# Patient Record
Sex: Female | Born: 1985 | Race: White | Hispanic: No | Marital: Married | Smoking: Current every day smoker
Health system: Southern US, Community
[De-identification: ages and names within clinical notes are randomized; demographics above are authoritative.]

## PROBLEM LIST (undated history)

## (undated) DIAGNOSIS — E785 Hyperlipidemia, unspecified: Secondary | ICD-10-CM

## (undated) DIAGNOSIS — K760 Fatty (change of) liver, not elsewhere classified: Secondary | ICD-10-CM

## (undated) DIAGNOSIS — N393 Stress incontinence (female) (male): Secondary | ICD-10-CM

## (undated) DIAGNOSIS — R000 Tachycardia, unspecified: Secondary | ICD-10-CM

## (undated) DIAGNOSIS — N39490 Overflow incontinence: Secondary | ICD-10-CM

## (undated) DIAGNOSIS — C801 Malignant (primary) neoplasm, unspecified: Secondary | ICD-10-CM

## (undated) DIAGNOSIS — L732 Hidradenitis suppurativa: Secondary | ICD-10-CM

## (undated) HISTORY — PX: MYRINGOTOMY: SHX2060

## (undated) HISTORY — DX: Hyperlipidemia, unspecified: E78.5

## (undated) HISTORY — PX: BREAST ENHANCEMENT SURGERY: SHX7

## (undated) HISTORY — PX: ABDOMINAL SURGERY: SHX537

## (undated) HISTORY — DX: Fatty (change of) liver, not elsewhere classified: K76.0

## (undated) HISTORY — PX: TEAR DUCT PROBING: SHX793

## (undated) HISTORY — PX: ABDOMINAL HYSTERECTOMY: SHX81

## (undated) HISTORY — PX: TUBAL LIGATION: SHX77

---

## 2003-06-03 ENCOUNTER — Emergency Department (HOSPITAL_COMMUNITY): Admission: EM | Admit: 2003-06-03 | Discharge: 2003-06-04 | Payer: Self-pay | Admitting: Emergency Medicine

## 2003-06-03 ENCOUNTER — Emergency Department (HOSPITAL_COMMUNITY): Admission: AD | Admit: 2003-06-03 | Discharge: 2003-06-03 | Payer: Self-pay | Admitting: Family Medicine

## 2003-06-23 ENCOUNTER — Ambulatory Visit (HOSPITAL_COMMUNITY): Admission: RE | Admit: 2003-06-23 | Discharge: 2003-06-23 | Payer: Self-pay | Admitting: Obstetrics & Gynecology

## 2003-07-14 ENCOUNTER — Inpatient Hospital Stay (HOSPITAL_COMMUNITY): Admission: AD | Admit: 2003-07-14 | Discharge: 2003-07-14 | Payer: Self-pay | Admitting: Obstetrics & Gynecology

## 2005-01-11 ENCOUNTER — Emergency Department (HOSPITAL_COMMUNITY): Admission: EM | Admit: 2005-01-11 | Discharge: 2005-01-11 | Payer: Self-pay | Admitting: Family Medicine

## 2005-03-12 ENCOUNTER — Ambulatory Visit (HOSPITAL_COMMUNITY): Admission: RE | Admit: 2005-03-12 | Discharge: 2005-03-12 | Payer: Self-pay | Admitting: Family Medicine

## 2005-05-09 ENCOUNTER — Emergency Department (HOSPITAL_COMMUNITY): Admission: EM | Admit: 2005-05-09 | Discharge: 2005-05-10 | Payer: Self-pay | Admitting: Emergency Medicine

## 2005-08-12 ENCOUNTER — Emergency Department (HOSPITAL_COMMUNITY): Admission: EM | Admit: 2005-08-12 | Discharge: 2005-08-12 | Payer: Self-pay | Admitting: Emergency Medicine

## 2005-08-12 ENCOUNTER — Inpatient Hospital Stay (HOSPITAL_COMMUNITY): Admission: EM | Admit: 2005-08-12 | Discharge: 2005-08-14 | Payer: Self-pay | Admitting: Psychiatry

## 2005-08-13 ENCOUNTER — Ambulatory Visit: Payer: Self-pay | Admitting: Psychiatry

## 2005-11-15 ENCOUNTER — Emergency Department (HOSPITAL_COMMUNITY): Admission: EM | Admit: 2005-11-15 | Discharge: 2005-11-15 | Payer: Self-pay | Admitting: Emergency Medicine

## 2005-12-11 ENCOUNTER — Encounter: Admission: RE | Admit: 2005-12-11 | Discharge: 2005-12-11 | Payer: Self-pay | Admitting: Obstetrics and Gynecology

## 2007-04-09 ENCOUNTER — Ambulatory Visit: Payer: Self-pay | Admitting: Physician Assistant

## 2007-04-09 ENCOUNTER — Inpatient Hospital Stay (HOSPITAL_COMMUNITY): Admission: AD | Admit: 2007-04-09 | Discharge: 2007-04-09 | Payer: Self-pay | Admitting: Obstetrics & Gynecology

## 2007-05-03 ENCOUNTER — Inpatient Hospital Stay (HOSPITAL_COMMUNITY): Admission: AD | Admit: 2007-05-03 | Discharge: 2007-05-03 | Payer: Self-pay | Admitting: Obstetrics

## 2007-09-09 ENCOUNTER — Emergency Department (HOSPITAL_COMMUNITY): Admission: EM | Admit: 2007-09-09 | Discharge: 2007-09-09 | Payer: Self-pay | Admitting: Emergency Medicine

## 2007-09-30 ENCOUNTER — Observation Stay (HOSPITAL_COMMUNITY): Admission: EM | Admit: 2007-09-30 | Discharge: 2007-10-02 | Payer: Self-pay | Admitting: Emergency Medicine

## 2007-10-04 ENCOUNTER — Emergency Department (HOSPITAL_COMMUNITY): Admission: EM | Admit: 2007-10-04 | Discharge: 2007-10-05 | Payer: Self-pay | Admitting: Emergency Medicine

## 2008-04-29 ENCOUNTER — Emergency Department (HOSPITAL_COMMUNITY): Admission: EM | Admit: 2008-04-29 | Discharge: 2008-04-30 | Payer: Self-pay | Admitting: Emergency Medicine

## 2008-06-01 ENCOUNTER — Emergency Department (HOSPITAL_COMMUNITY): Admission: EM | Admit: 2008-06-01 | Discharge: 2008-06-01 | Payer: Self-pay | Admitting: Emergency Medicine

## 2008-07-14 ENCOUNTER — Emergency Department (HOSPITAL_COMMUNITY): Admission: EM | Admit: 2008-07-14 | Discharge: 2008-07-14 | Payer: Self-pay | Admitting: Family Medicine

## 2010-12-19 NOTE — H&P (Signed)
Robin Melton, Robin Melton            ACCOUNT NO.:  [id number]   MEDICAL RECORD NO.:  [id number]          PATIENT TYPE:  INP   LOCATION:  3021                         FACILITY:  [facility]   PHYSICIAN:  Eduard Clos, MDDATE OF BIRTH:  15-Apr-1986   DATE OF ADMISSION:  09/29/2007  DATE OF DISCHARGE:                              HISTORY & PHYSICAL   CHIEF COMPLAINT:  Chest pain.   HISTORY OF PRESENT ILLNESS:  A 25 year old female with no significant  past medical history, presently complaining of chest pain.  She states  she has been having these symptoms for the last three days which was  increased in intensity and frequency where wherein she decided to come  to the ER. The patient in addition also found that the pain increased  with having something to eat.  In the ER, the patient was found to have  slightly elevated lipase with an normal sonogram.  Because of pain was  not getting improved.  The patient admitted for further management of  her possible pancreatitis.  The patient denies any associated  palpitations, shortness of breath, diaphoresis, but does have mild  nausea but still has an appetite.  Denies any diarrhea, fever or chills.  Denies any dizziness or loss of consciousness.   PAST MEDICAL HISTORY:  Intermittent palpitations for which she takes  Toprol-XL   PAST SURGICAL HISTORY:  Surgery for ear tubes and lacrimal ducts.   MEDICATIONS:  1. Toprol-XL 12.5 mg p.o. twice daily.  2. Multivitamins.   ALLERGIES:  SULFA.   FAMILY HISTORY:  Significant for patient's family having diabetes and  CAD.   SOCIAL HISTORY:  The patient denies smoking cigarettes. Drinks alcohol  occasionally.  Denies any drug abuse.   REVIEW OF SYSTEMS:  As per history of present illness.  Nothing of  significance.   PHYSICAL EXAMINATION:  GENERAL:  Patient examined at bedside, in no  acute distress.  VITAL SIGNS:  Blood pressure is 114/70, pulse 80 per minute, temperature  97.7,  respirations 18 per minute, oxygen saturation 96%.  HEENT:  Anicteric, no pallor.  .  CHEST:  Bilateral air entry present.  No rhonchi, no crepitations.  HEART:  S1 and S2.  ABDOMEN:  There is mild epigastric and right flank tenderness.  No  rigidity.  No discoloration.  Bowel sounds stable.  CNS:  Alert and oriented to time, place and person.  Motor in lower  extremities 5/5.  EXTREMITIES:  Peripheral pulses failed. No edema.   LABORATORY DATA:  Urine pregnancy negative.  CMP:  Sodium 138, potassium  4.2, chloride 105, carbon dioxide 25, glucose 94, BUN 15, creatinine  0.6, calcium 9.2, albumin 4.2, AST 20, ALT 26, alk phos 79, total  bilirubin 0.6.  Troponin less than 0.05.  CK-MB less than 1.  CBC:  WBC  7.5, hemoglobin 14.9, hematocrit 42.8, platelets 289,000. Urinalysis:  Leukocytes moderate.  Wbc's 3 to 6.  Lipase 79.   ASSESSMENT:  1. Possible pancreatitis.  2. Chest pain.  3. Possible urinary tract infection.   PLAN:  1. Admit the patient to telemetry, under team G of [facility].  2. Cardiac enzymes.  3. Will place the patient NPO except medications.  Start on IV fluids.  4. Followup urine culture.  5. Place the patient on pain medications.  6. As the patient's condition improves, we will start her on oral      diet.  7. Also get a CT scan of the abdomen and pelvis with contrast.      Eduard Clos, MD  Electronically Signed     ANK/MEDQ  D:  09/30/2007  T:  09/30/2007  Job:  [id number]

## 2010-12-22 NOTE — Discharge Summary (Signed)
Robin Melton, Robin Melton NO.:  [id number]   MEDICAL RECORD NO.:  [id number]          PATIENT TYPE:  IPS   LOCATION:  0301                          [facility]ACILITY[facility]  [facility]   PHYSICIAN:  Jeanice Lim, M.D. DATE OF BIRTH:  06/27/1986   DATE OF ADMISSION:  08/12/2005  DATE OF DISCHARGE:  08/14/2005                                 DISCHARGE SUMMARY   IDENTIFYING DATA:  This is a 25 year old married Caucasian female,  voluntarily admitted, presented after becoming tearful, panicky, felt she  would rather die than go on with how she felt.  Mother became agitated.  Endorsed 6 months progressive increase of depressed mood with mood lability,  racing thoughts, violence and bloody nightmares.  Primary care Safiya Girdler  [facility].  No alcohol or drug history.   ADMISSION MEDICATIONS:  None.   ALLERGIES:  SULFA.   PHYSICAL AND NEUROLOGICAL EXAMINATION:  Essentially within normal limits.  Neurologically nonfocal,   MENTAL STATUS EXAM:  Fully alert, tearful, labile affect, anxious, tearful.  Soft tone, decreased amount, decreased productivity.  Mood labile,  dysphoric, anxious, tearful, depressed, overwhelmed, desperate.  Thought  process positive passive suicidal ideation, no clear plan, would rather die  than feel the way she feels, no homicidal ideation, no auditory or visual  hallucinations.  Cognitively intact.  Judgment and insight impaired.   ADMISSION DIAGNOSES:  AXIS I:  Bipolar disorder II, rule out generalized  anxiety disorder or major depressive disorder, severe, single episode.  AXIS II:  None.  AXIS III:  None.  AXIS IV:  Moderate stressors.  AXIS V:  30/65.   HOSPITAL COURSE:  The patient was admitted and ordered routine p.r.n.  medications, underwent further monitoring, and was encouraged to participate  in individual, group and milieu therapy.  The patient was placed on safety  checks, pregnancy test was done.  Medications were reconciled.  The  patient  was started on Zoloft targeting depressive and anxiety symptoms, and  Risperdal due to the degree of agitation, low dose.  The patient reports  doing well after sleeping, first night that she had slept well in 18 months  as per patient, feeling much better, reporting no acute suicidal ideation,  more calm, less labile.  Husband wanted patient and patient wanted to be  discharged due to the trauma of her being hospital confined, feeling that  this was counter-therapeutic since she was doing much better and feeling  completely safe and that she would do well in intensive outpatient setting,  which she was agreeable with, therefore the patient was discharged with no  acute safety issues or psychiatric clinical criteria to continue inpatient  treatment.  The patient was discharged after medication education, advised  to have caution with driving and risk of EPS, TD, importance of monitoring  tolerance issues, dependency issues and precautions and medication education  reviewed at time of discharge, including purpose and side effects, and  discharged on:  1.  Celexa 10 mg q.a.m.  2.  Risperdal 0.5 mg at 8 p.m.  3.  Depakote ER 500 mg at 8 p.m.  4.  Ambien  10 mg q.h.s. p.r.n.  5.  Klonopin 0.5 mg 1/2 at 8 p.m.  The patient was advised this should be a      short-term medication.   DISPOSITION:  The patient was to follow up at [facility]  Intensive Outpatient clinic for very close monitoring due to brief stay  and  would start on Wednesday, January 10 at 8:45.   DISCHARGE DIAGNOSES:  AXIS I:  Bipolar disorder II, rule out generalized  anxiety disorder or major depressive disorder, severe, single episode.  AXIS II:  None.  AXIS III:  None.  AXIS IV:  Moderate stressors.  AXIS V:  Global assessment of functioning on discharge was 55-60.      Jeanice Lim, M.D.  Electronically Signed     JEM/MEDQ  D:  09/08/2005  T:  09/09/2005  Job:  [id number]

## 2010-12-22 NOTE — Consult Note (Signed)
NAMEIBTISAM, BENGE            ACCOUNT NO.:  [id number]   MEDICAL RECORD NO.:  [id number]          PATIENT TYPE:  EMS   LOCATION:  ED                           FACILITY:  [facility]   PHYSICIAN:  Kristine Garbe. Ezzard Standing, M.D.DATE OF BIRTH:  01/03/1986   DATE OF CONSULTATION:  11/15/2005  DATE OF DISCHARGE:                                   CONSULTATION   REASON FOR CONSULTATION:  Robin Melton is a 25 year old female who has  had a sore throat for a couple of days.  This evening, she had a little bit  of difficulty swallowing especially when she was lying down.  She presented  to the emergency room where she had a lateral soft tissue x-ray of the neck  which demonstrated some mild supraglottic swelling and ENT was consulted.  On examination, the patient has no airway problems and no trouble speaking  at all.  Neck exam revealed no significant swelling in the neck.  Oral exam  revealed generous sized tonsils with no exudate.  Indirect laryngoscopy  revealed a clear base of tongue.  Epiglottis was normal with no significant  swelling.  She had some slight edema of the arytenoids, but vocal cords were  clear and airway was clear.   IMPRESSION:  Pharyngitis.   RECOMMENDATIONS:  She had an IV placed and gave her a dose of Rocephin 2 g  IV and placed her on Augmentin suspension 600 mg b.i.d. x1 week and Tylenol  p.r.n. pain.  She will follow up in my office if she had any increasing  problems.           ______________________________  Kristine Garbe Ezzard Standing, M.D.     CEN/MEDQ  D:  11/15/2005  T:  11/15/2005  Job:  [id number]

## 2011-04-27 LAB — CBC
HCT: 37.1
HCT: 40
HCT: 42.8
Hemoglobin: 12.9
Hemoglobin: 14.9
MCHC: 34.9
MCV: 88.1
MCV: 89.5
Platelets: 273
Platelets: 289
RBC: 4.16
RBC: 4.78
RDW: 12.9
RDW: 13.2
WBC: 5.4
WBC: 7.5

## 2011-04-27 LAB — COMPREHENSIVE METABOLIC PANEL
ALT: 26
AST: 20
Albumin: 3.6
Albumin: 4.2
Alkaline Phosphatase: 79
BUN: 15
BUN: 7
CO2: 25
Calcium: 9
Calcium: 9.2
Chloride: 105
Creatinine, Ser: 0.65
Creatinine, Ser: 0.66
GFR calc Af Amer: >60
GFR calc non Af Amer: >60
Glucose, Bld: 94
Potassium: 3.5
Potassium: 4.2
Sodium: 138
Total Bilirubin: 0.6
Total Protein: 6.5
Total Protein: 7.4

## 2011-04-27 LAB — URINALYSIS, ROUTINE W REFLEX MICROSCOPIC
Bilirubin Urine: NEGATIVE
Glucose, UA: NEGATIVE
Hgb urine dipstick: NEGATIVE
Ketones, ur: NEGATIVE
Ketones, ur: NEGATIVE
Nitrite: NEGATIVE
Nitrite: NEGATIVE
Protein, ur: NEGATIVE
Specific Gravity, Urine: 1.015
Specific Gravity, Urine: 1.021
Urobilinogen, UA: 0.2
pH: 5
pH: 7

## 2011-04-27 LAB — POCT CARDIAC MARKERS
CKMB, poc: <1 — ABNORMAL LOW
Myoglobin, poc: 16.8
Operator id: 277751
Troponin i, poc: <0.05

## 2011-04-27 LAB — BASIC METABOLIC PANEL
BUN: 14
BUN: 3 — ABNORMAL LOW
CO2: 25
Calcium: 8.7
Chloride: 105
Chloride: 109
Creatinine, Ser: 0.52
GFR calc Af Amer: >60
GFR calc non Af Amer: >60
Glucose, Bld: 100 — ABNORMAL HIGH
Glucose, Bld: 88
Potassium: 3.6
Potassium: 3.7
Sodium: 138

## 2011-04-27 LAB — DIFFERENTIAL
Basophils Absolute: 0.1
Basophils Relative: 1
Eosinophils Absolute: 0.2
Eosinophils Relative: 2
Lymphocytes Relative: 26
Lymphocytes Relative: 38
Lymphs Abs: 2.3
Lymphs Abs: 2.9
Monocytes Absolute: 0.6
Monocytes Absolute: 0.7
Monocytes Relative: 7
Monocytes Relative: 8
Neutro Abs: 3.9
Neutro Abs: 5.7
Neutrophils Relative %: 51

## 2011-04-27 LAB — PREGNANCY, URINE
Preg Test, Ur: NEGATIVE
Preg Test, Ur: NEGATIVE

## 2011-04-27 LAB — URINE CULTURE: Colony Count: 35000

## 2011-04-27 LAB — CK TOTAL AND CKMB (NOT AT ARMC)
CK, MB: 0.7
Relative Index: INVALID
Total CK: 58

## 2011-04-27 LAB — CARDIAC PANEL(CRET KIN+CKTOT+MB+TROPI)
CK, MB: 0.6
CK, MB: 0.6
Relative Index: INVALID
Relative Index: INVALID
Relative Index: INVALID
Total CK: 56
Troponin I: <0.01

## 2011-04-27 LAB — LIPID PANEL
Cholesterol: 248 — ABNORMAL HIGH
HDL: 26 — ABNORMAL LOW
LDL Cholesterol: UNDETERMINED
Total CHOL/HDL Ratio: 9.5
Triglycerides: 488 — ABNORMAL HIGH
VLDL: UNDETERMINED

## 2011-04-27 LAB — TSH: TSH: 1.755

## 2011-04-27 LAB — LIPASE, BLOOD
Lipase: 65 — ABNORMAL HIGH
Lipase: 79 — ABNORMAL HIGH

## 2011-04-27 LAB — TROPONIN I: Troponin I: 0.01

## 2011-04-27 LAB — URINE MICROSCOPIC-ADD ON

## 2011-04-30 LAB — POCT CARDIAC MARKERS
CKMB, poc: <1 — ABNORMAL LOW
Troponin i, poc: <0.05

## 2011-05-07 LAB — POCT CARDIAC MARKERS
CKMB, poc: <1 — ABNORMAL LOW
Troponin i, poc: <0.05

## 2011-05-07 LAB — URINALYSIS, ROUTINE W REFLEX MICROSCOPIC
Nitrite: NEGATIVE
Protein, ur: NEGATIVE
Specific Gravity, Urine: 1.029
Urobilinogen, UA: 0.2

## 2011-05-07 LAB — URINE CULTURE: Colony Count: >=100000

## 2011-05-07 LAB — URINE MICROSCOPIC-ADD ON

## 2011-05-11 LAB — POCT URINALYSIS DIP (DEVICE)
Glucose, UA: NEGATIVE mg/dL
Hgb urine dipstick: NEGATIVE
Ketones, ur: NEGATIVE mg/dL
Specific Gravity, Urine: 1.02 (ref 1.005–1.030)
Urobilinogen, UA: 0.2 mg/dL (ref 0.0–1.0)

## 2011-05-11 LAB — URINE CULTURE

## 2011-05-17 LAB — URINALYSIS, ROUTINE W REFLEX MICROSCOPIC
Bilirubin Urine: NEGATIVE
Hgb urine dipstick: NEGATIVE
Ketones, ur: NEGATIVE
Nitrite: NEGATIVE
Urobilinogen, UA: 0.2

## 2011-05-18 LAB — URINALYSIS, ROUTINE W REFLEX MICROSCOPIC
Glucose, UA: NEGATIVE
Hgb urine dipstick: NEGATIVE
Protein, ur: 30 — AB
Urobilinogen, UA: 1

## 2011-05-18 LAB — URINE MICROSCOPIC-ADD ON

## 2011-05-18 LAB — URINE CULTURE
Colony Count: NO GROWTH
Culture: NO GROWTH

## 2011-05-18 LAB — WET PREP, GENITAL: Clue Cells Wet Prep HPF POC: NONE SEEN

## 2013-06-29 ENCOUNTER — Emergency Department (INDEPENDENT_AMBULATORY_CARE_PROVIDER_SITE_OTHER)
Admission: EM | Admit: 2013-06-29 | Discharge: 2013-06-29 | Disposition: A | Discharge status: Home / Self Care | Source: Home / Self Care | Attending: Family Medicine | Admitting: Family Medicine

## 2013-06-29 ENCOUNTER — Encounter: Payer: Self-pay | Admitting: Emergency Medicine

## 2013-06-29 DIAGNOSIS — R300 Dysuria: Secondary | ICD-10-CM

## 2013-06-29 DIAGNOSIS — N390 Urinary tract infection, site not specified: Secondary | ICD-10-CM

## 2013-06-29 LAB — POCT URINALYSIS DIP (MANUAL ENTRY)
Glucose, UA: NEGATIVE
Ketones, POC UA: NEGATIVE
Protein Ur, POC: NEGATIVE
Spec Grav, UA: 1.025 (ref 1.005–1.03)
Urobilinogen, UA: 0.2 (ref 0–1)
pH, UA: 5.5 (ref 5–8)

## 2013-06-29 LAB — POCT CBC W AUTO DIFF (K'VILLE URGENT CARE)

## 2013-06-29 MED ORDER — PHENAZOPYRIDINE HCL 200 MG PO TABS: 200.0000 mg | ORAL_TABLET | Freq: Three times a day (TID) | ORAL | Status: DC

## 2013-06-29 MED ORDER — CIPROFLOXACIN HCL 500 MG PO TABS: 500.0000 mg | ORAL_TABLET | Freq: Two times a day (BID) | ORAL | Status: DC

## 2013-06-29 NOTE — ED Provider Notes (Signed)
CSN: [id number]     Arrival date & time 06/29/13  4098 History   First MD Initiated Contact with Patient 06/29/13 [phone]     Chief Complaint  Patient presents with  . Dysuria     HPI Comments: Patient complains of onset of dysuria about two weeks ago that has become worse.  She has developed frequency, urgency, hesitancy, nocturia, and occasional leakage.  Over the past week she has had a low grade fever.  She has nausea but no vomiting.  She has developed lower back ache. Her last UTI was about two years ago.  Patient is a 27 y.o. female presenting with dysuria. The history is provided by the patient.  Dysuria This is a new problem. Episode onset: 2 weeks ago. The problem occurs constantly. The problem has been gradually worsening. Pertinent negatives include no abdominal pain. Exacerbated by: urination. Nothing relieves the symptoms. Treatments tried: Azo. The treatment provided no relief.    History reviewed. No pertinent past medical history. Past Surgical History  Procedure Laterality Date  . Tubal ligation    . Myringotomy    . Tear duct probing     Family History  Problem Relation Age of Onset  . Diabetes Mother   . Hypertension Mother   . Cancer Mother     skin CA  . Diabetes Sister    History  Substance Use Topics  . Smoking status: Current Every Day Smoker -- 0.50 packs/day    Types: Cigarettes  . Smokeless tobacco: Never Used  . Alcohol Use: No    Review of Systems  Gastrointestinal: Negative for abdominal pain.  Genitourinary: Positive for dysuria.  All other systems reviewed and are negative.    Allergies  Bee venom; Morphine and related; and Sulfur  Home Medications   Current Outpatient Rx  Name  Route  Sig  Dispense  Refill  . ciprofloxacin (CIPRO) 500 MG tablet   Oral   Take 1 tablet (500 mg total) by mouth 2 (two) times daily.   14 tablet   0   . phenazopyridine (PYRIDIUM) 200 MG tablet   Oral   Take 1 tablet (200 mg total) by mouth 3 (three)  times daily. Take with food.   6 tablet   0    BP 130/82  Pulse 96  Temp(Src) 97.8 F (36.6 C) (Oral)  Resp 14  Ht 5\' 2"  (1.575 m)  Wt 232 lb (105.235 kg)  BMI 42.42 kg/m2  SpO2 98% Physical Exam Nursing notes and Vital Signs reviewed. Appearance:  Patient appears stated age, and in no acute distress.  Patient is obese (BMI 42.4) Eyes:  Pupils are equal, round, and reactive to light and accomodation.  Extraocular movement is intact.  Conjunctivae are not inflamed  Pharynx:  Normal; moist mucous membranes  Neck:  Supple.   No adenopathy Lungs:  Clear to auscultation.  Breath sounds are equal.  Heart:  Regular rate and rhythm without murmurs, rubs, or gallops.  Abdomen:   Mild tenderness over bladder without masses or hepatosplenomegaly.  Bowel sounds are present.   Right CVA/flank tenderness present. Extremities:  No edema.  No calf tenderness Skin:  No rash present.   ED Course  Procedures  none    Labs Reviewed  URINE CULTURE  POCT URINALYSIS DIP (MANUAL ENTRY):  BLO trace- lysed; LEU moderate, otherwise negative  POCT CBC:  WBC 7.8; LY 31.4; MO 7.8; GR 60.8; Hgb 14.2; Platelets 219       MDM  1. Dysuria   2. UTI (urinary tract infection); normal white blood count reassuring    Urine culture pending.  Begin Cipro and Pyridium Increase fluid intake. If symptoms become significantly worse during the night or over the weekend, proceed to the local emergency room. Followup with Family Doctor if not improved in one week.     Lattie Haw, MD 06/29/13 [phone]

## 2013-06-29 NOTE — ED Notes (Signed)
Robin Melton c/o 2 weeks of low back pain, dysuria and polyuria. Intermittent chills and sweats. No hematuria.

## 2013-07-01 LAB — URINE CULTURE: Colony Count: >=100000

## 2013-08-05 ENCOUNTER — Other Ambulatory Visit: Payer: Self-pay

## 2013-11-05 ENCOUNTER — Encounter: Payer: Self-pay | Admitting: Emergency Medicine

## 2013-11-05 ENCOUNTER — Emergency Department (INDEPENDENT_AMBULATORY_CARE_PROVIDER_SITE_OTHER)
Admission: EM | Admit: 2013-11-05 | Discharge: 2013-11-05 | Disposition: A | Discharge status: Home / Self Care | Source: Home / Self Care | Attending: Emergency Medicine | Admitting: Emergency Medicine

## 2013-11-05 DIAGNOSIS — I471 Supraventricular tachycardia, unspecified: Secondary | ICD-10-CM

## 2013-11-05 DIAGNOSIS — M940 Chondrocostal junction syndrome [Tietze]: Secondary | ICD-10-CM

## 2013-11-05 HISTORY — DX: Tachycardia, unspecified: R00.0

## 2013-11-05 MED ORDER — METOPROLOL TARTRATE 25 MG PO TABS: 25.0000 mg | ORAL_TABLET | Freq: Two times a day (BID) | ORAL | Status: DC

## 2013-11-05 NOTE — ED Notes (Signed)
Chest pain, tachycardia, SOB started this am

## 2013-11-05 NOTE — ED Provider Notes (Signed)
CSN: [id number]     Arrival date & time 11/05/13  1719 History   First MD Initiated Contact with Patient 11/05/13 1737     Chief Complaint  Patient presents with  . Chest Pain   (Consider location/radiation/quality/duration/timing/severity/associated sxs/prior Treatment) HPI Patient with history of SVT (her normal BP is 110-130) comes in today with SVT symptoms.  She was fixing her hair this morning and feels she twisted her torso and developed L sided CP.  Tender to touch, continuous, certain movements exacerbate, resting takes away.  Cardio doesn't worsen.  She used to be on Metoprolol 25mg  BID, but stopped 1 year ago because couldn't follow up with cardiology because didn't have insurance.  On meds, HR around 90-100.  She feels somewhat anxious, but feels she just pulled a muscle.  No SOB, radiation of pain, blurry vision, LOC, HA.  No history of anxiety, but she says that she has a lot of stress in life.   Past Medical History  Diagnosis Date  . Tachycardia    Past Surgical History  Procedure Laterality Date  . Tubal ligation    . Myringotomy    . Tear duct probing     Family History  Problem Relation Age of Onset  . Diabetes Mother   . Hypertension Mother   . Cancer Mother     skin CA  . Diabetes Sister    History  Substance Use Topics  . Smoking status: Current Every Day Smoker -- 0.50 packs/day    Types: Cigarettes  . Smokeless tobacco: Never Used  . Alcohol Use: No    Review of Systems  All other systems reviewed and are negative.    Allergies  Bee venom; Morphine and related; and Sulfur  Home Medications   Current Outpatient Rx  Name  Route  Sig  Dispense  Refill  . ciprofloxacin (CIPRO) 500 MG tablet   Oral   Take 1 tablet (500 mg total) by mouth 2 (two) times daily.   14 tablet   0   . metoprolol tartrate (LOPRESSOR) 25 MG tablet   Oral   Take 1 tablet (25 mg total) by mouth 2 (two) times daily.   60 tablet   0   . phenazopyridine (PYRIDIUM) 200  MG tablet   Oral   Take 1 tablet (200 mg total) by mouth 3 (three) times daily. Take with food.   6 tablet   0    BP 135/87  Pulse 123  Temp(Src) 98.2 F (36.8 C) (Oral)  Ht 5\' 2"  (1.575 m)  Wt 223 lb (101.152 kg)  BMI 40.78 kg/m2  SpO2 99% Physical Exam  Nursing note and vitals reviewed. Constitutional: He is oriented to person, place, and time. He appears well-developed and well-nourished. He is cooperative.  Non-toxic appearance. No distress.  HENT:  Head: Normocephalic and atraumatic.  Right Ear: Tympanic membrane, external ear and ear canal normal.  Left Ear: Tympanic membrane and ear canal normal.  Nose: Nose normal.  Mouth/Throat: Uvula is midline and oropharynx is clear and moist.  Eyes: No scleral icterus.  Neck: Neck supple. Normal range of motion present.  Cardiovascular: Regular rhythm and normal heart sounds.   No extrasystoles are present. Tachycardia present.  PMI is not displaced.   No murmur heard. Pulmonary/Chest: Effort normal and breath sounds normal. No respiratory distress. He has no decreased breath sounds. He has no wheezes. He has no rhonchi.    Neurological: He is alert and oriented to person, place, and time.  Skin: Skin is warm and dry.  Psychiatric: His speech is normal and behavior is normal. Thought content normal. His mood appears anxious (slightly).    ED Course  Procedures (including critical care time) Labs Review Labs Reviewed - No data to display Imaging Review No results found.   MDM   1. Paroxysmal supraventricular tachycardia    Chronic SVT & acute costochondritis.  Carotid massage didn't change HR (123 to 119).  This HR is her normal HR.  EKG shows sinus tach, but otherwise normal.  Pt given Rx for Metoprolol 25mg  BID.  Info given to follow up with cardiology now that she has insurance.  ER precautions given for worsening symptoms, chest pain, SOB, etc.  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    Janeann Forehand,  MD 11/05/13 [phone]

## 2013-12-15 ENCOUNTER — Encounter: Payer: Self-pay | Admitting: Cardiology

## 2013-12-15 ENCOUNTER — Encounter: Payer: Self-pay | Admitting: *Deleted

## 2013-12-15 DIAGNOSIS — R079 Chest pain, unspecified: Secondary | ICD-10-CM | POA: Insufficient documentation

## 2013-12-15 DIAGNOSIS — R000 Tachycardia, unspecified: Secondary | ICD-10-CM | POA: Insufficient documentation

## 2013-12-16 ENCOUNTER — Encounter: Payer: Self-pay | Admitting: *Deleted

## 2013-12-16 ENCOUNTER — Encounter: Payer: Self-pay | Admitting: Cardiology

## 2013-12-16 ENCOUNTER — Ambulatory Visit (INDEPENDENT_AMBULATORY_CARE_PROVIDER_SITE_OTHER): Admitting: Cardiology

## 2013-12-16 VITALS — BP 126/84 | HR 101 | Ht 62.0 in | Wt 231.1 lb

## 2013-12-16 DIAGNOSIS — R000 Tachycardia, unspecified: Secondary | ICD-10-CM

## 2013-12-16 DIAGNOSIS — Z720 Tobacco use: Secondary | ICD-10-CM

## 2013-12-16 DIAGNOSIS — R0609 Other forms of dyspnea: Secondary | ICD-10-CM

## 2013-12-16 DIAGNOSIS — R0600 Dyspnea, unspecified: Secondary | ICD-10-CM

## 2013-12-16 DIAGNOSIS — F17200 Nicotine dependence, unspecified, uncomplicated: Secondary | ICD-10-CM

## 2013-12-16 DIAGNOSIS — R0989 Other specified symptoms and signs involving the circulatory and respiratory systems: Secondary | ICD-10-CM

## 2013-12-16 MED ORDER — METOPROLOL TARTRATE 25 MG PO TABS: 25.0000 mg | ORAL_TABLET | Freq: Two times a day (BID) | ORAL | Status: DC

## 2013-12-16 NOTE — Assessment & Plan Note (Signed)
Symptoms most consistent with musculoskeletal pain. Given palpitations and dyspnea I will arrange an echocardiogram to assess LV function and wall motion.

## 2013-12-16 NOTE — Assessment & Plan Note (Signed)
Patient counseled on discontinuing. 

## 2013-12-16 NOTE — Patient Instructions (Signed)
Your physician recommends that you schedule a follow-up appointment in: 3 MONTHS WITH DR CRENSHAW  Your physician has requested that you have an echocardiogram. Echocardiography is a painless test that uses sound waves to create images of your heart. It provides your doctor with information about the size and shape of your heart and how well your heart's chambers and valves are working. This procedure takes approximately one hour. There are no restrictions for this procedure.   Your physician has recommended that you wear a 48 HOUR holter monitor. Holter monitors are medical devices that record the heart's electrical activity. Doctors most often use these monitors to diagnose arrhythmias. Arrhythmias are problems with the speed or rhythm of the heartbeat. The monitor is a small, portable device. You can wear one while you do your normal daily activities. This is usually used to diagnose what is causing palpitations/syncope (passing out).    

## 2013-12-16 NOTE — Progress Notes (Signed)
     HPI: 28 year old female for evaluation of chest pain and palpitations. Patient states she has had an elevated heart rate for years. When it reaches 120 she notices a pounding sensation in her chest. She has dyspnea on exertion but no orthopnea, PND or pedal edema. She also has dizziness with exertion. She was seen in [place] approximately 6 years ago and had a workup and was told she had tachycardia. She was placed on metoprolol. She was recently seen also for chest pain and increase with moving her left upper extremity. No history of syncope.   Current Outpatient Prescriptions  Medication Sig Dispense Refill  . metoprolol tartrate (LOPRESSOR) 25 MG tablet Take 25 mg by mouth 2 (two) times daily.       No current facility-administered medications for this visit.    Allergies  Allergen Reactions  . Bee Venom Hives and Swelling  . Morphine And Related   . Sulfur     Past Medical History  Diagnosis Date  . Tachycardia     Past Surgical History  Procedure Laterality Date  . Tubal ligation    . Myringotomy    . Tear duct probing      History   Social History  . Marital Status: Married    Spouse Name: N/A    Number of Children: 3  . Years of Education: N/A   Occupational History  .  Other   Social History Main Topics  . Smoking status: Current Every Day Smoker -- 0.50 packs/day    Types: Cigarettes  . Smokeless tobacco: Never Used  . Alcohol Use: Yes     Comment: Rarely  . Drug Use: No  . Sexual Activity: Not on file   Other Topics Concern  . Not on file   Social History Narrative  . No narrative on file    Family History  Problem Relation Age of Onset  . Diabetes Mother   . Hypertension Mother   . Cancer Mother     skin CA  . Diabetes Sister     ROS: no fevers or chills, productive cough, hemoptysis, dysphasia, odynophagia, melena, hematochezia, dysuria, hematuria, rash, seizure activity, orthopnea, PND, pedal edema, claudication. Remaining  systems are negative.  Physical Exam:   Blood pressure 126/84, pulse 101, height 5\' 2"  (1.575 m), weight 231 lb 1.3 oz (104.817 kg).  General:  Well developed/obese in NAD Skin warm/dry Patient not depressed No peripheral clubbing Back-normal HEENT-normal/normal eyelids Neck supple/normal carotid upstroke bilaterally; no bruits; no JVD; no thyromegaly chest - CTA/ normal expansion CV - RRR/normal S1 and S2; no murmurs, rubs or gallops;  PMI nondisplaced Abdomen -NT/ND, no HSM, no mass, + bowel sounds, no bruit 2+ femoral pulses, no bruits Ext-no edema, chords, 2+ DP Neuro-grossly nonfocal  ECG Sinus rhythm, right axis deviation, Slight diffuse ST elevation.

## 2013-12-16 NOTE — Assessment & Plan Note (Signed)
Patient states she has an elevated heart rate on a daily basis. This appears to be a sinus tachycardia although not entirely clear. Schedule Holter monitor to further assess. Obtain records from previous cardiologist. I will also check a TSH and hemoglobin. Continue metoprolol. We can increase the dose in the future if needed.

## 2013-12-17 LAB — TSH: TSH: 1.152 u[IU]/mL (ref 0.350–4.500)

## 2013-12-17 LAB — CBC
HEMATOCRIT: 40.8 % (ref 39.0–52.0)
HEMOGLOBIN: 14.3 g/dL (ref 13.0–17.0)
MCH: 32.6 pg (ref 26.0–34.0)
MCHC: 35 g/dL (ref 30.0–36.0)
MCV: 92.9 fL (ref 78.0–100.0)
PLATELETS: 252 10*3/uL (ref 150–400)
RBC: 4.39 MIL/uL (ref 4.22–5.81)
RDW: 13.2 % (ref 11.5–15.5)
WBC: 7.6 10*3/uL (ref 4.0–10.5)

## 2013-12-30 ENCOUNTER — Encounter: Payer: Self-pay | Admitting: *Deleted

## 2013-12-30 ENCOUNTER — Encounter (INDEPENDENT_AMBULATORY_CARE_PROVIDER_SITE_OTHER)

## 2013-12-30 DIAGNOSIS — R0989 Other specified symptoms and signs involving the circulatory and respiratory systems: Secondary | ICD-10-CM

## 2013-12-30 DIAGNOSIS — R0600 Dyspnea, unspecified: Secondary | ICD-10-CM

## 2013-12-30 DIAGNOSIS — R0609 Other forms of dyspnea: Secondary | ICD-10-CM

## 2013-12-30 DIAGNOSIS — R000 Tachycardia, unspecified: Secondary | ICD-10-CM

## 2013-12-30 NOTE — Progress Notes (Signed)
Patient ID: Robin Melton, female   DOB: 06-30-1986, 30 y.o.   MRN: [id number] [name] 48 hour holter monitor applied to patient.

## 2014-01-01 ENCOUNTER — Ambulatory Visit (HOSPITAL_COMMUNITY): Attending: Cardiology | Admitting: Radiology

## 2014-01-01 ENCOUNTER — Encounter: Payer: Self-pay | Admitting: *Deleted

## 2014-01-01 DIAGNOSIS — R0600 Dyspnea, unspecified: Secondary | ICD-10-CM

## 2014-01-01 DIAGNOSIS — R0609 Other forms of dyspnea: Secondary | ICD-10-CM | POA: Insufficient documentation

## 2014-01-01 DIAGNOSIS — R072 Precordial pain: Secondary | ICD-10-CM

## 2014-01-01 DIAGNOSIS — R0989 Other specified symptoms and signs involving the circulatory and respiratory systems: Principal | ICD-10-CM | POA: Insufficient documentation

## 2014-01-01 DIAGNOSIS — R0602 Shortness of breath: Secondary | ICD-10-CM

## 2014-01-01 DIAGNOSIS — R000 Tachycardia, unspecified: Secondary | ICD-10-CM

## 2014-01-01 NOTE — Progress Notes (Signed)
Echocardiogram performed.  

## 2014-01-07 ENCOUNTER — Telehealth: Payer: Self-pay | Admitting: *Deleted

## 2014-01-07 NOTE — Telephone Encounter (Signed)
Monitor reviewed by dr Stanford Breed shows sinus with rare couplet. Mob[place] 1 early am.   Left message for pt to call for monitor and lab results

## 2014-01-11 NOTE — Telephone Encounter (Signed)
F/u    Pt returning a call and stated no one called her back.

## 2014-01-11 NOTE — Telephone Encounter (Signed)
lmtcb

## 2014-01-11 NOTE — Telephone Encounter (Signed)
I spoke with pt & she is a

## 2014-01-11 NOTE — Telephone Encounter (Signed)
I spoke with pt & she is aware of echo & holter monitor results Horton Chin RN

## 2014-01-11 NOTE — Telephone Encounter (Signed)
Follow up ° ° ° ° °Returning a nurses call to get test results °

## 2014-02-28 ENCOUNTER — Emergency Department (INDEPENDENT_AMBULATORY_CARE_PROVIDER_SITE_OTHER)
Admission: EM | Admit: 2014-02-28 | Discharge: 2014-02-28 | Disposition: A | Discharge status: Home / Self Care | Source: Home / Self Care | Attending: Emergency Medicine | Admitting: Emergency Medicine

## 2014-02-28 ENCOUNTER — Encounter: Payer: Self-pay | Admitting: Emergency Medicine

## 2014-02-28 DIAGNOSIS — J209 Acute bronchitis, unspecified: Secondary | ICD-10-CM

## 2014-02-28 DIAGNOSIS — J0100 Acute maxillary sinusitis, unspecified: Secondary | ICD-10-CM

## 2014-02-28 HISTORY — DX: Malignant (primary) neoplasm, unspecified: C80.1

## 2014-02-28 MED ORDER — AZITHROMYCIN 250 MG PO TABS: ORAL_TABLET | ORAL | Status: DC

## 2014-02-28 MED ORDER — BENZONATATE 100 MG PO CAPS: 100.0000 mg | ORAL_CAPSULE | Freq: Three times a day (TID) | ORAL | Status: DC

## 2014-02-28 NOTE — ED Provider Notes (Signed)
CSN: [id number]     Arrival date & time 02/28/14  1452 History   First MD Initiated Contact with Patient 02/28/14 1454     Chief Complaint  Patient presents with  . Cough  . Nasal Congestion  . Generalized Body Aches   (Consider location/radiation/quality/duration/timing/severity/associated sxs/prior Treatment) HPI Enas complains of fevers, chills, body aches, eye drainage, sore throat, runny nose with yellow drainage, wheezing at night and productive cough with yellow sputum for 4 days.   URI HISTORY  Darwin is a 28 y.o. female who complains of onset of cold symptoms for 4 days.  Have been using over-the-counter treatment which helps a little bit.  + chills/sweats +  Fever  +  Nasal congestion +  Discolored Post-nasal drainage + sinus pain/pressure + sore throat  +  cough Rare wheezing, at night + chest congestion No hemoptysis No shortness of breath No pleuritic pain  No itchy/red eyes No earache  Minimal nausea No vomiting No abdominal pain No current diarrhea  No skin rashes +  Fatigue Mild myalgias No headache   Past Medical History  Diagnosis Date  . Tachycardia   . Cancer     skin   Past Surgical History  Procedure Laterality Date  . Tubal ligation    . Myringotomy    . Tear duct probing     Family History  Problem Relation Age of Onset  . Diabetes Mother   . Hypertension Mother   . Cancer Mother     skin CA  . Diabetes Sister    History  Substance Use Topics  . Smoking status: Current Every Day Smoker -- 1.00 packs/day for 12 years    Types: Cigarettes  . Smokeless tobacco: Never Used  . Alcohol Use: Yes     Comment: Rarely   OB History   Grav Para Term Preterm Abortions TAB SAB Ect Mult Living                 Review of Systems  All other systems reviewed and are negative.   Allergies  Bee venom; Morphine and related; and Sulfur  Home Medications   Prior to Admission medications   Medication Sig Start Date End  Date Taking? Authorizing Provider  metoprolol tartrate (LOPRESSOR) 25 MG tablet Take 1 tablet (25 mg total) by mouth 2 (two) times daily. 12/16/13  Yes Lelon Perla, MD  azithromycin (ZITHROMAX Z-PAK) 250 MG tablet Take 2 tablets on day one, then 1 tablet daily on days 2 through 5 02/28/14   Jacqulyn Cane, MD  benzonatate (TESSALON) 100 MG capsule Take 1 capsule (100 mg total) by mouth every 8 (eight) hours. As needed for cough 02/28/14   Jacqulyn Cane, MD   BP 119/80  Pulse 102  Temp(Src) 98.6 F (37 C) (Oral)  Ht 5\' 2"  (1.575 m)  Wt 231 lb (104.781 kg)  BMI 42.24 kg/m2  SpO2 96%  LMP 02/20/2014 Physical Exam  Nursing note and vitals reviewed. Constitutional: She is oriented to person, place, and time. She appears well-developed and well-nourished. No distress.  HENT:  Head: Normocephalic and atraumatic.  Right Ear: Tympanic membrane, external ear and ear canal normal.  Left Ear: Tympanic membrane, external ear and ear canal normal.  Nose: Mucosal edema and rhinorrhea present. Right sinus exhibits maxillary sinus tenderness. Left sinus exhibits maxillary sinus tenderness.  Mouth/Throat: Oropharynx is clear and moist. No oral lesions. No oropharyngeal exudate.  Eyes: Right eye exhibits no discharge. Left eye exhibits no discharge. No scleral  icterus.  Neck: Neck supple.  Cardiovascular: Normal rate, regular rhythm and normal heart sounds.   Pulmonary/Chest: Effort normal. She has no wheezes. She has rhonchi. She has no rales.  Lymphadenopathy:    She has no cervical adenopathy.  Neurological: She is alert and oriented to person, place, and time.  Skin: Skin is warm and dry.  Psychiatric: She has a normal mood and affect.    ED Course  Procedures (including critical care time) Labs Review Labs Reviewed - No data to display  Imaging Review No results found.   MDM   1. Acute bronchitis, unspecified organism   2. Acute maxillary sinusitis, recurrence not specified     Treatment options discussed, as well as risks, benefits, alternatives. Patient voiced understanding and agreement with the following plans: Z-Pak Tessalon pearls as needed for cough Other symptomatic care discussed Currently no wheezing, we discussed the option of prednisone, but we both agree this is not indicated at this time. Follow-up with your primary care doctor in 5-7 days if not improving, or sooner if symptoms become worse. Precautions discussed. Red flags discussed. Questions invited and answered. Patient voiced understanding and agreement.     Jacqulyn Cane, MD 02/28/14 [phone]

## 2014-02-28 NOTE — ED Notes (Signed)
Robin Melton complains of fevers, chills, body aches, eye drainage, sore throat, runny nose with yellow drainage, wheezing at night and productive cough with yellow sputum for 2 days.

## 2014-03-17 ENCOUNTER — Encounter: Admitting: Cardiology

## 2014-03-17 NOTE — Progress Notes (Signed)
      HPI: FU chest pain and palpitations. Echocardiogram in May of 2015 showed normal LV function and mild left atrial enlargement. Holter monitor in May of 2015 showed sinus rhythm with a rare couplet and Mobitz 1 in the early am hours. Laboratories in May of 2015 showed normal TSH and hemoglobin. Since last seen,    Current Outpatient Prescriptions  Medication Sig Dispense Refill  . azithromycin (ZITHROMAX Z-PAK) 250 MG tablet Take 2 tablets on day one, then 1 tablet daily on days 2 through 5  1 each  1  . benzonatate (TESSALON) 100 MG capsule Take 1 capsule (100 mg total) by mouth every 8 (eight) hours. As needed for cough  21 capsule  0  . metoprolol tartrate (LOPRESSOR) 25 MG tablet Take 1 tablet (25 mg total) by mouth 2 (two) times daily.  180 tablet  3   No current facility-administered medications for this visit.     Past Medical History  Diagnosis Date  . Tachycardia   . Cancer     skin    Past Surgical History  Procedure Laterality Date  . Tubal ligation    . Myringotomy    . Tear duct probing      History   Social History  . Marital Status: Married    Spouse Name: N/A    Number of Children: 3  . Years of Education: N/A   Occupational History  .  Other   Social History Main Topics  . Smoking status: Current Every Day Smoker -- 1.00 packs/day for 12 years    Types: Cigarettes  . Smokeless tobacco: Never Used  . Alcohol Use: Yes     Comment: Rarely  . Drug Use: No  . Sexual Activity: Not on file   Other Topics Concern  . Not on file   Social History Narrative  . No narrative on file    ROS: no fevers or chills, productive cough, hemoptysis, dysphasia, odynophagia, melena, hematochezia, dysuria, hematuria, rash, seizure activity, orthopnea, PND, pedal edema, claudication. Remaining systems are negative.  Physical Exam: Well-developed well-nourished in no acute distress.  Skin is warm and dry.  HEENT is normal.  Neck is supple.  Chest is clear  to auscultation with normal expansion.  Cardiovascular exam is regular rate and rhythm.  Abdominal exam nontender or distended. No masses palpated. Extremities show no edema. neuro grossly intact  ECG     This encounter was created in error - please disregard.

## 2014-04-20 ENCOUNTER — Encounter: Payer: Self-pay | Admitting: Cardiology

## 2014-05-17 ENCOUNTER — Encounter: Payer: Self-pay | Admitting: Cardiology

## 2014-07-13 ENCOUNTER — Emergency Department (INDEPENDENT_AMBULATORY_CARE_PROVIDER_SITE_OTHER)
Admission: EM | Admit: 2014-07-13 | Discharge: 2014-07-13 | Disposition: A | Discharge status: Home / Self Care | Source: Home / Self Care

## 2014-07-13 ENCOUNTER — Encounter: Payer: Self-pay | Admitting: *Deleted

## 2014-07-13 DIAGNOSIS — R197 Diarrhea, unspecified: Secondary | ICD-10-CM

## 2014-07-13 DIAGNOSIS — J209 Acute bronchitis, unspecified: Secondary | ICD-10-CM

## 2014-07-13 DIAGNOSIS — J0100 Acute maxillary sinusitis, unspecified: Secondary | ICD-10-CM

## 2014-07-13 MED ORDER — HYDROCOD POLST-CHLORPHEN POLST 10-8 MG/5ML PO LQCR: 5.0000 mL | Freq: Every evening | ORAL | Status: DC | PRN

## 2014-07-13 MED ORDER — PREDNISONE 50 MG PO TABS: 50.0000 mg | ORAL_TABLET | Freq: Every day | ORAL | Status: DC

## 2014-07-13 MED ORDER — AZITHROMYCIN 250 MG PO TABS: ORAL_TABLET | ORAL | Status: DC

## 2014-07-13 NOTE — Discharge Instructions (Signed)
Nebulizer every 2-6 hours as needed.  Prednisone for 5 days.  zpak for 5 days.  Cough syrup for bedtime.   Acute Bronchitis Bronchitis is when the airways that extend from the windpipe into the lungs get red, puffy, and painful (inflamed). Bronchitis often causes thick spit (mucus) to develop. This leads to a cough. A cough is the most common symptom of bronchitis. In acute bronchitis, the condition usually begins suddenly and goes away over time (usually in 2 weeks). Smoking, allergies, and asthma can make bronchitis worse. Repeated episodes of bronchitis may cause more lung problems. HOME CARE  Rest.  Drink enough fluids to keep your pee (urine) clear or pale yellow (unless you need to limit fluids as told by your doctor).  Only take over-the-counter or prescription medicines as told by your doctor.  Avoid smoking and secondhand smoke. These can make bronchitis worse. If you are a smoker, think about using nicotine gum or skin patches. Quitting smoking will help your lungs heal faster.  Reduce the chance of getting bronchitis again by:  Washing your hands often.  Avoiding people with cold symptoms.  Trying not to touch your hands to your mouth, nose, or eyes.  Follow up with your doctor as told. GET HELP IF: Your symptoms do not improve after 1 week of treatment. Symptoms include:  Cough.  Fever.  Coughing up thick spit.  Body aches.  Chest congestion.  Chills.  Shortness of breath.  Sore throat. GET HELP RIGHT AWAY IF:   You have an increased fever.  You have chills.  You have severe shortness of breath.  You have bloody thick spit (sputum).  You throw up (vomit) often.  You lose too much body fluid (dehydration).  You have a severe headache.  You faint. MAKE SURE YOU:   Understand these instructions.  Will watch your condition.  Will get help right away if you are not doing well or get worse. Document Released: 01/09/2008 Document Revised:  03/25/2013 Document Reviewed: 01/13/2013 [facility] Patient Information 2015 [place], [place]. This information is not intended to replace advice given to you by your health care provider. Make sure you discuss any questions you have with your health care provider.

## 2014-07-13 NOTE — ED Notes (Signed)
Pt c/o productive cough, night time SOB, dizziness, diarrhea, chills/sweats, bilateral ear ache, and nausea x 10 days. Denies fever.

## 2014-07-13 NOTE — ED Provider Notes (Signed)
CSN: [id number]     Arrival date & time 07/13/14  1023 History   None    Chief Complaint  Patient presents with  . Cough  . Diarrhea  . Shortness of Breath   (Consider location/radiation/quality/duration/timing/severity/associated sxs/prior Treatment) HPI  Pt is a 28 yo female who presents to the clinic with productive cough worse at night, SOB, dizziness, diarrhea, chills, sweats, ear pain and nausea for 10 days. Denies any fever. She has nebulizer but not used. She also has inhaler that she has used and seemed to help. She has tried multiple OTC medictions mucinex, sudafed, cough syrups and nothing seems to help.   Past Medical History  Diagnosis Date  . Tachycardia   . Cancer     skin   Past Surgical History  Procedure Laterality Date  . Tubal ligation    . Myringotomy    . Tear duct probing     Family History  Problem Relation Age of Onset  . Diabetes Mother   . Hypertension Mother   . Cancer Mother     skin CA  . Diabetes Sister    History  Substance Use Topics  . Smoking status: Current Every Day Smoker -- 1.00 packs/day for 12 years    Types: Cigarettes  . Smokeless tobacco: Never Used  . Alcohol Use: Yes     Comment: Rarely   OB History    No data available     Review of Systems  All other systems reviewed and are negative.   Allergies  Bee venom; Morphine and related; and Sulfur  Home Medications   Prior to Admission medications   Medication Sig Start Date End Date Taking? Authorizing Provider  azithromycin (ZITHROMAX Z-PAK) 250 MG tablet Take 2 tablets (500 mg) on  Day 1,  followed by 1 tablet (250 mg) once daily on Days 2 through 5. 07/13/14   Jade L Breeback, PA-C  benzonatate (TESSALON) 100 MG capsule Take 1 capsule (100 mg total) by mouth every 8 (eight) hours. As needed for cough 02/28/14   Jacqulyn Cane, MD  chlorpheniramine-HYDROcodone (TUSSIONEX) 10-8 MG/5ML [facility] Take 5 mLs by mouth at bedtime as needed for cough. 07/13/14   Jade L Breeback,  PA-C  metoprolol tartrate (LOPRESSOR) 25 MG tablet Take 1 tablet (25 mg total) by mouth 2 (two) times daily. Patient taking differently: Take 25 mg by mouth as needed.  12/16/13   Lelon Perla, MD  predniSONE (DELTASONE) 50 MG tablet Take 1 tablet (50 mg total) by mouth daily. 07/13/14   Jade L Breeback, PA-C   BP 139/87 mmHg  Pulse 99  Temp(Src) 98.1 F (36.7 C) (Oral)  Resp 18  Ht 5\' 2"  (1.575 m)  Wt 240 lb (108.863 kg)  BMI 43.89 kg/m2  SpO2 98% Physical Exam  Constitutional: She appears well-developed and well-nourished.  HENT:  Head: Normocephalic and atraumatic.  Right Ear: External ear normal.  Left Ear: External ear normal.  TM's clear.  Oropharynx erythematous without tonsillar swelling or exudate.   Eyes:  Injected conjunctiva bilateral watery discharge.   Neck: Normal range of motion. Neck supple.  Cardiovascular: Normal rate, regular rhythm and normal heart sounds.   Pulmonary/Chest:  Productive cough on exam today.  Could not take deep breaths without coughing.  Expiratory wheezing at base of both lungs.  No rhonchi.   Lymphadenopathy:    She has cervical adenopathy.  Psychiatric: She has a normal mood and affect. Her behavior is normal.    ED Course  Procedures (including critical care time) Labs Review Labs Reviewed - No data to display  Imaging Review No results found.   MDM   1. Acute bronchitis, unspecified organism   2. Acute maxillary sinusitis, recurrence not specified   3. Diarrhea    Pt given zpak, prednisone burst and cough syrup for bedtime.  HO given for symptomatic care.  Discussed diarrhea likely due to all the OTC medications. BRAT diet as tolerated.  Follow up if not improving.    Donella Stade, PA-C 07/13/14 1443

## 2014-07-17 ENCOUNTER — Telehealth: Payer: Self-pay | Admitting: Emergency Medicine

## 2014-07-17 MED ORDER — BENZONATATE 100 MG PO CAPS: 100.0000 mg | ORAL_CAPSULE | Freq: Three times a day (TID) | ORAL | Status: DC

## 2015-05-17 ENCOUNTER — Encounter: Payer: Self-pay | Admitting: *Deleted

## 2015-05-17 ENCOUNTER — Emergency Department (INDEPENDENT_AMBULATORY_CARE_PROVIDER_SITE_OTHER)
Admission: EM | Admit: 2015-05-17 | Discharge: 2015-05-17 | Disposition: A | Discharge status: Home / Self Care | Source: Home / Self Care | Attending: Family Medicine | Admitting: Family Medicine

## 2015-05-17 DIAGNOSIS — J069 Acute upper respiratory infection, unspecified: Secondary | ICD-10-CM | POA: Diagnosis not present

## 2015-05-17 DIAGNOSIS — M940 Chondrocostal junction syndrome [Tietze]: Secondary | ICD-10-CM

## 2015-05-17 DIAGNOSIS — B9789 Other viral agents as the cause of diseases classified elsewhere: Principal | ICD-10-CM

## 2015-05-17 MED ORDER — DOXYCYCLINE HYCLATE 100 MG PO CAPS: 100.0000 mg | ORAL_CAPSULE | Freq: Two times a day (BID) | ORAL | Status: DC

## 2015-05-17 MED ORDER — BENZONATATE 200 MG PO CAPS: 200.0000 mg | ORAL_CAPSULE | Freq: Every day | ORAL | Status: DC

## 2015-05-17 NOTE — ED Provider Notes (Signed)
CSN: [id number]     Arrival date & time 05/17/15  1825 History   First MD Initiated Contact with Patient 05/17/15 1921     Chief Complaint  Patient presents with  . Cough      HPI Comments: Patient awoke today with typical cold-like symptoms including mild sore throat, sinus congestion, headache, fatigue, and cough.  She has also had chills/sweats, dizziness, and nausea.  She vomited once after coughing up phlegm.   She had pneumonia last year.  She continues to smoke.  The history is provided by the patient.    Past Medical History  Diagnosis Date  . Tachycardia   . Cancer [facility])     skin   Past Surgical History  Procedure Laterality Date  . Tubal ligation    . Myringotomy    . Tear duct probing    . Breast enhancement surgery    . Abdominal hysterectomy     Family History  Problem Relation Age of Onset  . Diabetes Mother   . Hypertension Mother   . Cancer Mother     skin CA  . Diabetes Sister    Social History  Substance Use Topics  . Smoking status: Current Every Day Smoker -- 1.00 packs/day for 12 years    Types: Cigarettes  . Smokeless tobacco: Never Used  . Alcohol Use: No     Comment: Rarely   OB History    No data available     Review of Systems + sore throat + hoarse + cough + sneezing No pleuritic pain + wheezing + nasal congestion + post-nasal drainage No sinus pain/pressure No itchy/red eyes No earache + dizziness No hemoptysis + SOB with activity No fever, + chills/sweats + nausea + vomiting, resolved No abdominal pain No diarrhea No urinary symptoms No skin rash + fatigue No myalgias + headache Used OTC meds without relief  Allergies  Bee venom; Morphine and related; and Sulfur  Home Medications   Prior to Admission medications   Medication Sig Start Date End Date Taking? Authorizing Provider  benzonatate (TESSALON) 200 MG capsule Take 1 capsule (200 mg total) by mouth at bedtime. Take as needed for cough 05/17/15   Kandra Nicolas, MD  doxycycline (VIBRAMYCIN) 100 MG capsule Take 1 capsule (100 mg total) by mouth 2 (two) times daily. Take with food. 05/17/15   Kandra Nicolas, MD   Meds Ordered and Administered this Visit  Medications - No data to display  BP 155/81 mmHg  Pulse 112  Temp(Src) 98.2 F (36.8 C) (Oral)  Resp 18  Ht 5\' 2"  (1.575 m)  Wt 239 lb (108.41 kg)  BMI 43.70 kg/m2  SpO2 98%  LMP 02/20/2014 No data found.   Physical Exam Nursing notes and Vital Signs reviewed. Appearance:  Patient appears stated age, and in no acute distress.  Patient is obese (BMI 43.7) Eyes:  Pupils are equal, round, and reactive to light and accomodation.  Extraocular movement is intact.  Conjunctivae are not inflamed  Ears:  Canals normal.  Tympanic membranes normal.  Nose:  Congested turbinates.  No sinus tenderness.    Pharynx:  Normal Neck:  Supple.  Tender enlarged posterior nodes are palpated bilaterally  Lungs:  Clear to auscultation.  Breath sounds are equal.  Moving air well. Chest:  Distinct tenderness to palpation over the mid-sternum.  Heart:  Regular rate and rhythm without murmurs, rubs, or gallops.  Abdomen:  Nontender without masses or hepatosplenomegaly.  Bowel sounds are present.  No CVA or flank tenderness.  Extremities:  No edema.  No calf tenderness Skin:  No rash present.   ED Course  Procedures none  MDM   1. Viral URI with cough   2. Costochondritis    With a history of pneumonia in a smoker, will begin empiric doxycycline.  Prescription written for Benzonatate [facility]) to take at bedtime for night-time cough.  Take plain guaifenesin (1200mg  extended release tabs such as Mucinex) twice daily, with plenty of water, for cough and congestion.  May add Pseudoephedrine (30mg , one or two every 4 to 6 hours) for sinus congestion.  Get adequate rest.   May use Afrin nasal spray (or generic oxymetazoline) twice daily for about 5 days and then discontinue.  Also recommend using saline  nasal spray several times daily and saline nasal irrigation (AYR is a common brand).  Use Flonase nasal spray each morning after using Afrin nasal spray and saline nasal irrigation. Try warm salt water gargles for sore throat.  Stop all antihistamines for now, and other non-prescription cough/cold preparations. May take Ibuprofen 200mg , 4 tabs every 8 hours with food for chest/sternum discomfort, fever, headache, etc.   Follow-up with family doctor if not improving about10 days.     Kandra Nicolas, MD 05/21/15 [phone]

## 2015-05-17 NOTE — ED Notes (Signed)
Pt c/o productive cough, RT arm numbness, chest hurts, dizziness and nausea from phlegm x today. Took Theraflu at [facility].

## 2015-05-17 NOTE — Discharge Instructions (Signed)
Take plain guaifenesin (1200mg  extended release tabs such as Mucinex) twice daily, with plenty of water, for cough and congestion.  May add Pseudoephedrine (30mg , one or two every 4 to 6 hours) for sinus congestion.  Get adequate rest.   May use Afrin nasal spray (or generic oxymetazoline) twice daily for about 5 days and then discontinue.  Also recommend using saline nasal spray several times daily and saline nasal irrigation (AYR is a common brand).  Use Flonase nasal spray each morning after using Afrin nasal spray and saline nasal irrigation. Try warm salt water gargles for sore throat.  Stop all antihistamines for now, and other non-prescription cough/cold preparations. May take Ibuprofen 200mg , 4 tabs every 8 hours with food for chest/sternum discomfort, fever, headache, etc.   Follow-up with family doctor if not improving about10 days.

## 2015-06-14 ENCOUNTER — Encounter: Payer: Self-pay | Admitting: Osteopathic Medicine

## 2015-06-14 ENCOUNTER — Ambulatory Visit (INDEPENDENT_AMBULATORY_CARE_PROVIDER_SITE_OTHER): Admitting: Osteopathic Medicine

## 2015-06-14 ENCOUNTER — Ambulatory Visit (INDEPENDENT_AMBULATORY_CARE_PROVIDER_SITE_OTHER)

## 2015-06-14 VITALS — BP 131/59 | HR 120 | Temp 98.3°F | Ht 62.0 in | Wt 240.0 lb

## 2015-06-14 DIAGNOSIS — R05 Cough: Secondary | ICD-10-CM | POA: Diagnosis not present

## 2015-06-14 DIAGNOSIS — Z720 Tobacco use: Secondary | ICD-10-CM

## 2015-06-14 DIAGNOSIS — R059 Cough, unspecified: Secondary | ICD-10-CM

## 2015-06-14 DIAGNOSIS — R000 Tachycardia, unspecified: Secondary | ICD-10-CM | POA: Diagnosis not present

## 2015-06-14 DIAGNOSIS — F411 Generalized anxiety disorder: Secondary | ICD-10-CM

## 2015-06-14 MED ORDER — BUSPIRONE HCL 30 MG PO TABS: 15.0000 mg | ORAL_TABLET | Freq: Two times a day (BID) | ORAL | Status: DC

## 2015-06-14 MED ORDER — PROPRANOLOL HCL ER 60 MG PO CP24: 60.0000 mg | ORAL_CAPSULE | Freq: Every day | ORAL | Status: DC

## 2015-06-14 NOTE — Progress Notes (Signed)
HPI: Robin Melton is a 29 y.o. female who presents to [place]  today for chief complaint of:  Chief Complaint  Patient presents with  . Establish Care  . Cough  . Anxiety   RESP: C/o persistent cough. Seen 05/17/15 (approx 1 month ago) in urgent care for cough, viral URI and costochondritis. Records reviewed: treated for PNA given hx PNA and (+) tobacco use - Doxy plus Tessalon, recommended Sudafed, Mucinex, Afrin, Flonase, Ibuprofen. No imaging at that time.   CARDIO: C/o palpitations, has Hx on meds for this but no meds for past 3 years, previous w/u includes [place] heart, previously seen by Cardiologist  PSYCH: Anxiety is "really getting to me, I can't function" GAD7 high score as below. Previous treatment includes, has been on Welbutrin meds (+) hx postpartum depression/anxiety. Also history of bipolar disease and was on antipsychotics, but then was told not bipolar, was told she had PTSD, has been through counseling for some of these problems about 3 years ago and this helped somewhat. Husband is sick, hx TBI and subsequent problems and caring for her her husband has caused significant stress to patient.    Past medical, social and family history reviewed: Past Medical History  Diagnosis Date  . Tachycardia   . Cancer [facility])     skin   Past Surgical History  Procedure Laterality Date  . Tubal ligation    . Myringotomy    . Tear duct probing    . Breast enhancement surgery    . Abdominal hysterectomy     Social History  Substance Use Topics  . Smoking status: Current Every Day Smoker -- 1.00 packs/day for 12 years    Types: Cigarettes  . Smokeless tobacco: Never Used  . Alcohol Use: No     Comment: Rarely   Family History  Problem Relation Age of Onset  . Diabetes Mother   . Hypertension Mother   . Cancer Mother     skin CA  . Diabetes Sister     Current Outpatient Prescriptions  Medication Sig Dispense Refill  . benzonatate  (TESSALON) 200 MG capsule Take 1 capsule (200 mg total) by mouth at bedtime. Take as needed for cough 12 capsule 0  . doxycycline (VIBRAMYCIN) 100 MG capsule Take 1 capsule (100 mg total) by mouth 2 (two) times daily. Take with food. 20 capsule 0   No current facility-administered medications for this visit.   Allergies  Allergen Reactions  . Bee Venom Hives and Swelling  . Morphine And Related   . Sulfur       Review of Systems: CONSTITUTIONAL:  No  fever, no chills, No  unintentional weight changes HEAD/EYES/EARS/NOSE/THROAT: (+) headache, no vision change, no hearing change, No  sore throat CARDIAC: (+) chest pain, (+) pressure/palpitations, no orthopnea RESPIRATORY: Yes  cough, No  shortness of breath/wheeze GASTROINTESTINAL: No nausea, no vomiting, no abdominal pain, no blood in stool, no diarrhea, no constipation MUSCULOSKELETAL: No  myalgia/arthralgia GENITOURINARY: (+) leaking/incontinence, No abnormal genital bleeding/discharge SKIN: No rash/wounds/concerning lesions HEM/ONC: No easy bruising/bleeding, no abnormal lymph node ENDOCRINE: No polyuria/polydipsia/polyphagia, no heat/cold intolerance  NEUROLOGIC: No weakness, no dizziness, no slurred speech PSYCHIATRIC: No concerns with depression, (+) concerns with anxiety, (+) sleep problems    Exam:  BP 131/59 mmHg  Pulse 120  Temp(Src) 98.3 F (36.8 C) (Oral)  Ht 5\' 2"  (1.575 m)  Wt 240 lb (108.863 kg)  BMI 43.89 kg/m2  LMP 02/20/2014 Constitutional: VSS, see above. General  Appearance: alert, well-developed, well-nourished, NAD Eyes: Normal lids and conjunctive, non-icteric sclera, PERRLA Ears, Nose, Mouth, Throat: Normal external inspection ears/nares/mouth/lips/gums, TM normal bilaterally, MMM, posterior pharynx No  erythema No  exudate Neck: No masses, trachea midline. No thyroid enlargement/tenderness/mass appreciated. No lymphadenopathy Respiratory: Normal respiratory effort. no wheeze, no rhonchi, no  rales Cardiovascular: S1/S2 normal, no murmur, no rub/gallop auscultated.Reg rhythm, tachycardia which improves after rest.  No carotid bruit or JVD. No abdominal aortic bruit.  Pedal pulse II/IV bilaterally DP and PT.  No lower extremity edema. Gastrointestinal: Nontender, no masses. No hepatomegaly, no splenomegaly. No hernia appreciated. Bowel sounds normal. Rectal exam deferred.  Musculoskeletal: Gait normal. No clubbing/cyanosis of digits.  Neurological: No cranial nerve deficit on limited exam. Motor and sensation intact and symmetric Psychiatric: Normal judgment/insight. Anxious mood and affect. Oriented x3. PGQ2 Neg   No results found for this or any previous visit (from the past 72 hour(s)).  CXR personally reviewed   "CLINICAL DATA: Patient with cough for 1 month.  EXAM: CHEST 2 VIEW  COMPARISON: None.  FINDINGS: Normal cardiac and mediastinal contours. No consolidative pulmonary opacities. No pleural effusion or pneumothorax. Regional skeleton is unremarkable.  IMPRESSION: No active cardiopulmonary disease.   Electronically Signed  By: Lovey Newcomer M.D.  On: 06/14/2015 14:27"    ASSESSMENT/PLAN:  Cough - Plan: DG Chest 2 View - radiology read agrees with personal review of the images, no infectious process or other abnormality, consideration for possible smoker's cough and patient was advised to discontinue tobacco use. Other causes of chronic cough forcefully to patient including allergies, possible asthma, GERD, versus postinfectious cough lingering. Patient is still having cough in the next 2 weeks will consider further workup. Advised additional antibiotic therapy is not warranted at this time in my opinion, though if she gets worse please return to clinic for further evaluation. May consider pulmonary function tests as well.  Tobacco abuse disorder. Patient has been educated on importance of tobacco cessation to decrease risks of cardiovascular and  pulmonary disease and to improve overall health & wellbeing.   Anxiety state - Plan: busPIRone (BUSPAR) 30 MG tablet, propranolol ER (INDERAL LA) 60 MG 24 hr capsule, given patient's dictated history and multiple unclear diagnoses, hesitate to prescribe SSRI today, hesitant to prescribe controlled substances. A shingles 5 Will refer to psychiatry for definitive diagnosis, also advised counseling as patient to undergo medical management as well as talk therapy doesn't have better outcomes.  Tachycardia - h/o elevated HR and on meds for this until 3 years ago - Plan: propranolol ER (INDERAL LA) 60 MG 24 hr capsule. Echocardiogram normal per cardiology notes, no concerns from them, patient states that she was told to stop her other medications, per cardiology she was on metoprolol. I do not see any documentation from them that it was fine to stop this medication, the patient states that she was told it was okay to do so also don't see any record of discharge from cardiology practice, if patient continues to have symptoms despite treatment of anxiety will consider re-sending cardiology referral to Dr Stanford Breed     Return in about 2 weeks (around 06/28/2015), or if symptoms worsen or fail to improve, for MEDICATION MANAGEMENT.

## 2015-06-23 NOTE — Addendum Note (Signed)
Addended by: Huel Cote on: 06/23/2015 08:58 AM   Modules accepted: Orders

## 2015-06-28 ENCOUNTER — Ambulatory Visit (INDEPENDENT_AMBULATORY_CARE_PROVIDER_SITE_OTHER)

## 2015-06-28 ENCOUNTER — Encounter: Payer: Self-pay | Admitting: Osteopathic Medicine

## 2015-06-28 ENCOUNTER — Ambulatory Visit (INDEPENDENT_AMBULATORY_CARE_PROVIDER_SITE_OTHER): Admitting: Osteopathic Medicine

## 2015-06-28 VITALS — BP 118/81 | HR 101 | Ht 62.0 in | Wt 250.0 lb

## 2015-06-28 DIAGNOSIS — F411 Generalized anxiety disorder: Secondary | ICD-10-CM | POA: Insufficient documentation

## 2015-06-28 DIAGNOSIS — R1012 Left upper quadrant pain: Secondary | ICD-10-CM

## 2015-06-28 DIAGNOSIS — R1011 Right upper quadrant pain: Secondary | ICD-10-CM

## 2015-06-28 LAB — LIPID PANEL
Cholesterol: 183 mg/dL (ref 125–200)
HDL: 29 mg/dL — ABNORMAL LOW
LDL Cholesterol: 92 mg/dL
Total CHOL/HDL Ratio: 6.3 ratio — ABNORMAL HIGH
Triglycerides: 308 mg/dL — ABNORMAL HIGH
VLDL: 62 mg/dL — ABNORMAL HIGH

## 2015-06-28 LAB — CBC WITH DIFFERENTIAL/PLATELET
Basophils Absolute: 0 K/uL (ref 0.0–0.1)
Basophils Relative: 0 % (ref 0–1)
Eosinophils Absolute: 0.2 K/uL (ref 0.0–0.7)
Eosinophils Relative: 3 % (ref 0–5)
HCT: 40.9 % (ref 36.0–46.0)
Hemoglobin: 14.7 g/dL (ref 12.0–15.0)
Lymphocytes Relative: 30 % (ref 12–46)
Lymphs Abs: 2.2 K/uL (ref 0.7–4.0)
MCH: 33.3 pg (ref 26.0–34.0)
MCHC: 35.9 g/dL (ref 30.0–36.0)
MCV: 92.5 fL (ref 78.0–100.0)
MPV: 10.7 fL (ref 8.6–12.4)
Monocytes Absolute: 0.7 K/uL (ref 0.1–1.0)
Monocytes Relative: 9 % (ref 3–12)
Neutro Abs: 4.3 K/uL (ref 1.7–7.7)
Neutrophils Relative %: 58 % (ref 43–77)
Platelets: 227 K/uL (ref 150–400)
RBC: 4.42 MIL/uL (ref 3.87–5.11)
RDW: 13.5 % (ref 11.5–15.5)
WBC: 7.4 K/uL (ref 4.0–10.5)

## 2015-06-28 LAB — COMPLETE METABOLIC PANEL WITHOUT GFR
ALT: 36 U/L — ABNORMAL HIGH (ref 6–29)
AST: 19 U/L (ref 10–30)
Albumin: 3.8 g/dL (ref 3.6–5.1)
Alkaline Phosphatase: 48 U/L (ref 33–115)
BUN: 7 mg/dL (ref 7–25)
CO2: 23 mmol/L (ref 20–31)
Calcium: 8.6 mg/dL (ref 8.6–10.2)
Chloride: 107 mmol/L (ref 98–110)
Creat: 0.5 mg/dL (ref 0.50–1.10)
GFR, Est African American: >89 mL/min
GFR, Est Non African American: >89 mL/min
Glucose, Bld: 93 mg/dL (ref 65–99)
Potassium: 4.2 mmol/L (ref 3.5–5.3)
Sodium: 138 mmol/L (ref 135–146)
Total Bilirubin: 0.3 mg/dL (ref 0.2–1.2)
Total Protein: 6.3 g/dL (ref 6.1–8.1)

## 2015-06-28 LAB — LIPASE: Lipase: 21 U/L (ref 7–60)

## 2015-06-28 LAB — TSH: TSH: 1.129 u[IU]/mL (ref 0.350–4.500)

## 2015-06-28 MED ORDER — PAROXETINE HCL 20 MG PO TABS: 20.0000 mg | ORAL_TABLET | Freq: Every day | ORAL | Status: DC

## 2015-06-28 NOTE — Progress Notes (Signed)
HPI: Robin Melton is a 29 y.o. female who presents to [place]  today for chief complaint of:  Chief Complaint  Patient presents with  . Follow-up    medication   Patient was last seen 2 weeks ago, new patient at that time, multiple complaints at that visit included heart palpitations, anxiety, cough and respiratory complaints. Patient was asked to come back to further evaluate psychiatric issues   Anxiety: Better on the medicines but not totally resolved, patient was started on propranolol and BuSpar. She is taking BuSpar at one half tablet twice a day, has not gone up to 1 tablet twice a day. Palpitations have resolved. My concern at that time was she had reported a history of bipolar disorder and then was later told that this was not her problem. Talked about this more with her today, patient also filled out mood disorder questionnaire which was negative. Apparently after the delivery of her last child, patient was hospitalized for postpartum depression and possible psychosis. At that time she was told she is bipolar but on follow-up with psychiatry after postpartum improvement, was told that she was not in fact bipolar. She does not remember all the medications that she has been on in the past but knows that she had been on antipsychotics, Sertraline, Wellbutrin  and others.   Abdominal pain: Patient also concerned about intermittent abdominal pain, often generalized but particularly worse in right upper quadrant. She is concerned about gallbladder issues as the tend to run in her family.     Past medical, social and family history reviewed: Past Medical History  Diagnosis Date  . Tachycardia   . Cancer [facility])     skin   Past Surgical History  Procedure Laterality Date  . Tubal ligation    . Myringotomy    . Tear duct probing    . Breast enhancement surgery    . Abdominal hysterectomy     Social History  Substance Use Topics  . Smoking  status: Current Every Day Smoker -- 1.00 packs/day for 12 years    Types: Cigarettes  . Smokeless tobacco: Never Used  . Alcohol Use: No     Comment: Rarely   Family History  Problem Relation Age of Onset  . Diabetes Mother   . Hypertension Mother   . Cancer Mother     skin CA  . Hyperlipidemia Mother   . Diabetes Maternal Grandmother   . Cancer Maternal Grandfather   . Diabetes Maternal Grandfather   . Heart attack Maternal Grandfather     Current Outpatient Prescriptions  Medication Sig Dispense Refill  . busPIRone (BUSPAR) 30 MG tablet Take 0.5-1 tablets (15-30 mg total) by mouth 2 (two) times daily. 30 tablet 1  . propranolol ER (INDERAL LA) 60 MG 24 hr capsule Take 1 capsule (60 mg total) by mouth daily. 30 capsule 1   No current facility-administered medications for this visit.   Allergies  Allergen Reactions  . Bee Venom Hives and Swelling  . Morphine And Related   . Sulfur       Review of Systems: CONSTITUTIONAL:  No  fever, no chills, No  unintentional weight changes CARDIAC: No chest pain, no pressure/palpitations, no orthopnea RESPIRATORY: No  cough, No  shortness of breath/wheeze GASTROINTESTINAL: No nausea, no vomiting, (+)abdominal pain as noted HPI, no blood in stool, no diarrhea, no constipation MUSCULOSKELETAL: No  myalgia/arthralgia GENITOURINARY: No incontinence, No abnormal genital bleeding/discharge NEUROLOGIC: No weakness, no dizziness, no slurred  speech PSYCHIATRIC: (+) concerns with depression, (+) concerns with anxiety, no sleep problems    Exam:  BP 118/81 mmHg  Pulse 101  Ht 5\' 2"  (1.575 m)  Wt 250 lb (113.399 kg)  BMI 45.71 kg/m2  LMP 02/20/2014 Constitutional: VSS, see above. General Appearance: alert, well-developed, well-nourished, NAD Respiratory: Normal respiratory effort. no wheeze, no rhonchi, no rales Cardiovascular: S1/S2 normal, no murmur, no rub/gallop auscultated. RRR.  Gastrointestinal: (+)TTP RUQ, Neg Murphy's, no masses.  No hepatomegaly, no splenomegaly. No hernia appreciated. Bowel sounds normal. Rectal exam deferred.  Psychiatric: Normal judgment/insight. Normal mood and affect. Oriented x3.    No results found for this or any previous visit (from the past 72 hour(s)).    ASSESSMENT/PLAN: After further discussion with the patient, negative mood disorder screening, and comfortable prescribing SSRI therapy for generalized anxiety disorder, minimal concern for bipolar mood disorder. Patient is advised on side effects of medication, routine follow-up is important to determine medication safety, patient is amenable to referral downstairs to behavioral health for counseling. Abdominal pain: Labs and ultrasound as below  Generalized anxiety disorder - Plan: PARoxetine (PAXIL) 20 MG tablet, continue BuSpar and propranolol.  Anxiety state - Plan: TSH  Left upper quadrant pain - Plan: CBC with Differential/Platelet, COMPLETE METABOLIC PANEL WITH GFR, Lipid panel, Lipase, Urinalysis, US Abdomen Complete  Right upper quadrant pain - Plan: CBC with Differential/Platelet, COMPLETE METABOLIC PANEL WITH GFR, Lipid panel, Lipase, Urinalysis, US Abdomen Complete     Return in about 4 weeks (around 07/26/2015), or sooner if symptoms worsen or fail to improve, for [facility]. Consider follow-up sooner based on ultrasound her lab results.

## 2015-06-29 LAB — URINALYSIS
BILIRUBIN URINE: NEGATIVE
Glucose, UA: NEGATIVE
Hgb urine dipstick: NEGATIVE
Ketones, ur: NEGATIVE
Leukocytes, UA: NEGATIVE
NITRITE: NEGATIVE
Protein, ur: NEGATIVE
SPECIFIC GRAVITY, URINE: 1.021 (ref 1.001–1.035)
pH: 6 (ref 5.0–8.0)

## 2015-07-04 ENCOUNTER — Encounter: Payer: Self-pay | Admitting: Osteopathic Medicine

## 2015-07-04 ENCOUNTER — Ambulatory Visit (INDEPENDENT_AMBULATORY_CARE_PROVIDER_SITE_OTHER): Admitting: Osteopathic Medicine

## 2015-07-04 VITALS — BP 128/87 | HR 92 | Ht 62.0 in | Wt 248.0 lb

## 2015-07-04 DIAGNOSIS — E669 Obesity, unspecified: Secondary | ICD-10-CM

## 2015-07-04 DIAGNOSIS — K580 Irritable bowel syndrome with diarrhea: Secondary | ICD-10-CM | POA: Diagnosis not present

## 2015-07-04 DIAGNOSIS — E785 Hyperlipidemia, unspecified: Secondary | ICD-10-CM | POA: Insufficient documentation

## 2015-07-04 DIAGNOSIS — K760 Fatty (change of) liver, not elsewhere classified: Secondary | ICD-10-CM | POA: Insufficient documentation

## 2015-07-04 DIAGNOSIS — F411 Generalized anxiety disorder: Secondary | ICD-10-CM | POA: Diagnosis not present

## 2015-07-04 DIAGNOSIS — R1011 Right upper quadrant pain: Secondary | ICD-10-CM

## 2015-07-04 HISTORY — DX: Hyperlipidemia, unspecified: E78.5

## 2015-07-04 HISTORY — DX: Fatty (change of) liver, not elsewhere classified: K76.0

## 2015-07-04 MED ORDER — DICYCLOMINE HCL 10 MG PO CAPS: 10.0000 mg | ORAL_CAPSULE | Freq: Three times a day (TID) | ORAL | Status: DC | PRN

## 2015-07-04 NOTE — Progress Notes (Signed)
HPI: Robin Melton is a 29 y.o. female who presents to [place]  today for chief complaint of:  Chief Complaint  Patient presents with  . Follow-up    U/S RESULTS   Patient is here today to discuss ultrasound results and lab workup which was done for abdominal pain, she received voice messages before the weekend, was very concerned about possible fatty liver disease and what to do next.  . Location: upper right abdomen, nowhere else . Quality: cramping . Duration: 1 month + . Timing: Intermittent, worse after eating . Context: Also undergoing treatment for anxiety, recently started Paxil in addition to BuSpar and propranolol. Patient reports cutting abdominal pain, worse after eating, reports having to go to the bathroom soon after eating anything. . Assoc signs/symptoms: constipation has been an issue, eating causes going to the bathroom with loose stool.    Past medical, social and family history reviewed: Past Medical History  Diagnosis Date  . Tachycardia   . Cancer [facility])     skin   Past Surgical History  Procedure Laterality Date  . Tubal ligation    . Myringotomy    . Tear duct probing    . Breast enhancement surgery    . Abdominal hysterectomy     Social History  Substance Use Topics  . Smoking status: Current Every Day Smoker -- 1.00 packs/day for 12 years    Types: Cigarettes  . Smokeless tobacco: Never Used  . Alcohol Use: No     Comment: Rarely   Family History  Problem Relation Age of Onset  . Diabetes Mother   . Hypertension Mother   . Cancer Mother     skin CA  . Hyperlipidemia Mother   . Diabetes Maternal Grandmother   . Cancer Maternal Grandfather   . Diabetes Maternal Grandfather   . Heart attack Maternal Grandfather     Current Outpatient Prescriptions  Medication Sig Dispense Refill  . busPIRone (BUSPAR) 30 MG tablet Take 0.5-1 tablets (15-30 mg total) by mouth 2 (two) times daily. 30 tablet 1  .  PARoxetine (PAXIL) 20 MG tablet Take 1 tablet (20 mg total) by mouth daily. 30 tablet 1  . propranolol ER (INDERAL LA) 60 MG 24 hr capsule Take 1 capsule (60 mg total) by mouth daily. 30 capsule 1   No current facility-administered medications for this visit.   Allergies  Allergen Reactions  . Bee Venom Hives and Swelling  . Morphine And Related   . Sulfur       Review of Systems: CONSTITUTIONAL:  No  fever, no chills, No  unintentional weight changes CARDIAC: No chest pain, no pressure/palpitations, no orthopnea RESPIRATORY: No  cough, No shortness of breath/wheeze GASTROINTESTINAL: Occasional nausea, no vomiting, (+) abdominal pain as per history of present illness, no blood in stool, (+) loose stool and  diarrhea several times per day, no constipation MUSCULOSKELETAL: No  myalgia/arthralgia GENITOURINARY: No incontinence, No abnormal genital bleeding/discharge ENDOCRINE: No polyuria/polydipsia/polyphagia, no heat/cold intolerance  NEUROLOGIC: No weakness, no dizziness, no slurred speech PSYCHIATRIC: No concerns with depression, (+) concerns with anxiety, no sleep problems    Exam:  BP 128/87 mmHg  Pulse 92  Ht 5\' 2"  (1.575 m)  Wt 248 lb (112.492 kg)  BMI 45.35 kg/m2  LMP 02/20/2014 Constitutional: VSS, see above. General Appearance: alert, well-developed, well-nourished, NAD Psychiatric: Normal judgment/insight. Normal mood and affect. Oriented x3.   Lab results reviewed: CMP normal except for mildly elevated ALT, triglycerides concerning for  hypertriglyceridemia, low HDL, high VLDL, fasting sugar was normal  [place] results: 06/28/15 "CLINICAL DATA: Abdominal pain for 1 week EXAM: ULTRASOUND ABDOMEN COMPLETE COMPARISON: None. ...IMPRESSION: Increased liver echogenicity is most likely due to hepatic steatosis. While no focal liver lesions are identified, it must be cautioned that the sensitivity of ultrasound for focal liver lesions is diminished in this  circumstance. Portions of the pancreas are obscured by gas. Visualized portions of pancreas appear normal. Study otherwise unremarkable. Electronically Signed  By: Lowella Grip III M.D.  On: 06/28/2015 10:40"    ASSESSMENT/PLAN: Advised mild fatty liver on ultrasound in light of normal labs with exception of mild elevated liver enzymes was not terribly concerning or dangerous. Obesity, plus elevated triglycerides and VLDL as well as low HDL are more concerning and likely contributing to fatty liver. Patient advised pain is most likely due to irritable bowel symptoms, information was printed with regard to dietary and lifestyle changes, patient also requests referral to dietitian. I think this is reasonable to assist with weight loss and decreasing fatty foods. Patient was given Bentyl as needed for cramping, advised that we can consider long-term medication treatment if she is not showing some improvement with treatment of anxiety as well as dietary changes. we'll recheck labs in about 6 months  Right upper quadrant pain  Generalized anxiety disorder  Irritable bowel syndrome with diarrhea - Plan: dicyclomine (BENTYL) 10 MG capsule, Amb ref to Medical Nutrition Therapy-[facility]  Obesity - Plan: Amb ref to Medical Nutrition Therapy-[facility]  Fatty liver - Plan: Amb ref to Medical Nutrition Therapy-[facility]    Return in about 4 weeks (around 08/01/2015), or sooner if symptoms worsen or fail to improve, for recheck abdominal pain and anxiety.  Total time spent 25 minutes, greater than 50% of the visit was counseling and coordinating care for diagnosis of abdominal pain and fatty liver, obesity, cholesterol.

## 2015-07-04 NOTE — Patient Instructions (Signed)
Diet for Irritable Bowel Syndrome When you have irritable bowel syndrome (IBS), the foods you eat and your eating habits are very important. IBS may cause various symptoms, such as abdominal pain, constipation, or diarrhea. Choosing the right foods can help ease discomfort caused by these symptoms. Work with your health care provider and dietitian to find the best eating plan to help control your symptoms. WHAT GENERAL GUIDELINES DO I NEED TO FOLLOW?  Keep a food diary. This will help you identify foods that cause symptoms. Write down:  What you eat and when.  What symptoms you have.  When symptoms occur in relation to your meals.  Avoid foods that cause symptoms. Talk with your dietitian about other ways to get the same nutrients that are in these foods.  Eat more foods that contain fiber. Take a fiber supplement if directed by your dietitian.  Eat your meals slowly, in a relaxed setting.  Aim to eat 5-6 small meals per day. Do not skip meals.  Drink enough fluids to keep your urine clear or pale yellow.  Ask your health care provider if you should take an over-the-counter probiotic during flare-ups to help restore healthy gut bacteria.  If you have cramping or diarrhea, try making your meals low in fat and high in carbohydrates. Examples of carbohydrates are pasta, rice, whole grain breads and cereals, fruits, and vegetables.  If dairy products cause your symptoms to flare up, try eating less of them. You might be able to handle yogurt better than other dairy products because it contains bacteria that help with digestion. WHAT FOODS ARE NOT RECOMMENDED? The following are some foods and drinks that may worsen your symptoms:  Fatty foods, such as French fries.  Milk products, such as cheese or ice cream.  Chocolate.  Alcohol.  Products with caffeine, such as coffee.  Carbonated drinks, such as soda. The items listed above may not be a complete list of foods and beverages to  avoid. Contact your dietitian for more information. WHAT FOODS ARE GOOD SOURCES OF FIBER? Your health care provider or dietitian may recommend that you eat more foods that contain fiber. Fiber can help reduce constipation and other IBS symptoms. Add foods with fiber to your diet a little at a time so that your body can get used to them. Too much fiber at once might cause gas and swelling of your abdomen. The following are some foods that are good sources of fiber:  Apples.  Peaches.  Pears.  Berries.  Figs.  Broccoli (raw).  Cabbage.  Carrots.  Raw peas.  Kidney beans.  Lima beans.  Whole grain bread.  Whole grain cereal. FOR MORE INFORMATION  International Foundation for Functional Gastrointestinal Disorders: www.iffgd.org National Institute of Diabetes and Digestive and Kidney Diseases: www.niddk.nih.gov/health-information/health-topics/digestive-diseases/ibs/Pages/facts.aspx   This information is not intended to replace advice given to you by your health care provider. Make sure you discuss any questions you have with your health care provider.   Document Released: 10/13/2003 Document Revised: 08/13/2014 Document Reviewed: 10/23/2013 Elsevier Interactive Patient Education 2016 [employer]  

## 2015-07-15 ENCOUNTER — Other Ambulatory Visit: Payer: Self-pay | Admitting: Osteopathic Medicine

## 2015-07-18 ENCOUNTER — Other Ambulatory Visit: Payer: Self-pay

## 2015-07-18 DIAGNOSIS — F411 Generalized anxiety disorder: Secondary | ICD-10-CM

## 2015-07-18 MED ORDER — BUSPIRONE HCL 30 MG PO TABS
15.0000 mg | ORAL_TABLET | Freq: Two times a day (BID) | ORAL | Status: DC
Start: 2015-07-18 — End: 2015-08-22

## 2015-07-26 ENCOUNTER — Ambulatory Visit (INDEPENDENT_AMBULATORY_CARE_PROVIDER_SITE_OTHER): Admitting: Osteopathic Medicine

## 2015-07-26 DIAGNOSIS — F411 Generalized anxiety disorder: Secondary | ICD-10-CM

## 2015-07-26 NOTE — Progress Notes (Signed)
No show

## 2015-08-19 ENCOUNTER — Other Ambulatory Visit: Payer: Self-pay | Admitting: Osteopathic Medicine

## 2015-08-22 ENCOUNTER — Encounter: Payer: Self-pay | Admitting: Osteopathic Medicine

## 2015-08-22 ENCOUNTER — Ambulatory Visit (INDEPENDENT_AMBULATORY_CARE_PROVIDER_SITE_OTHER): Admitting: Osteopathic Medicine

## 2015-08-22 VITALS — BP 139/82 | HR 111 | Ht 62.0 in | Wt 255.0 lb

## 2015-08-22 DIAGNOSIS — K219 Gastro-esophageal reflux disease without esophagitis: Secondary | ICD-10-CM

## 2015-08-22 DIAGNOSIS — F411 Generalized anxiety disorder: Secondary | ICD-10-CM | POA: Diagnosis not present

## 2015-08-22 MED ORDER — BUSPIRONE HCL 30 MG PO TABS: 30.0000 mg | ORAL_TABLET | Freq: Two times a day (BID) | ORAL | Status: DC

## 2015-08-22 MED ORDER — PROPRANOLOL HCL ER 60 MG PO CP24: 60.0000 mg | ORAL_CAPSULE | Freq: Every day | ORAL | Status: DC

## 2015-08-22 MED ORDER — PAROXETINE HCL 40 MG PO TABS: 40.0000 mg | ORAL_TABLET | Freq: Every day | ORAL | Status: DC

## 2015-08-22 NOTE — Progress Notes (Signed)
HPI: Robin Melton is a 30 y.o. female who presents to [facility]  today for chief complaint of:  Chief Complaint  Patient presents with  . Follow-up    ANXIETY MEDICATION    Anxiety: Better on the medicines but not totally resolved, patient was started on propranolol and BuSpar. She is taking BuSpar at one tablet twice a day. Palpitations have resolved. Doing better n the Paxil, moods are better, still some anxiety and seems to tap foot a lot. She does not remember all the medications that she has been on in the past but knows that she had been on antipsychotics, Sertraline, Wellbutrin  and others.   Heartburn: Patient also concerned about intermittent GERD over the past few weeks. Seen prior for abdominal pain, no concernns on US/labs. Taking Zantac daily intermittently.    Past medical, social and family history reviewed: Past Medical History  Diagnosis Date  . Tachycardia   . Cancer ([place])     skin  . Fatty liver 07/04/2015  . Hyperlipidemia 07/04/2015    High triglycerides, low HDL, high VLDL   Past Surgical History  Procedure Laterality Date  . Tubal ligation    . Myringotomy    . Tear duct probing    . Breast enhancement surgery    . Abdominal hysterectomy     Social History  Substance Use Topics  . Smoking status: Current Every Day Smoker -- 1.00 packs/day for 12 years    Types: Cigarettes  . Smokeless tobacco: Never Used  . Alcohol Use: No     Comment: Rarely   Family History  Problem Relation Age of Onset  . Diabetes Mother   . Hypertension Mother   . Cancer Mother     skin CA  . Hyperlipidemia Mother   . Diabetes Maternal Grandmother   . Cancer Maternal Grandfather   . Diabetes Maternal Grandfather   . Heart attack Maternal Grandfather     Current Outpatient Prescriptions  Medication Sig Dispense Refill  . busPIRone (BUSPAR) 30 MG tablet Take 1 tablet (30 mg total) by mouth 2 (two) times daily. 60 tablet 1  .  dicyclomine (BENTYL) 10 MG capsule Take 1 capsule (10 mg total) by mouth 3 (three) times daily as needed for spasms. 30 capsule 1  . PARoxetine (PAXIL) 40 MG tablet Take 1 tablet (40 mg total) by mouth daily. FOLLOW UP APPOINTMENT NEEDED FOR FURTHER REFILLS 30 tablet 1  . propranolol ER (INDERAL LA) 60 MG 24 hr capsule Take 1 capsule (60 mg total) by mouth daily. FOLLOW UP APPOINTMENT NEEDED FOR FURTHER REFILLS 30 capsule 1  . ranitidine (ZANTAC) 150 MG tablet Take 150 mg by mouth daily.     No current facility-administered medications for this visit.   Allergies  Allergen Reactions  . Bee Venom Hives and Swelling  . Morphine And Related   . Sulfur       Review of Systems: CONSTITUTIONAL:  No  fever, no chills, No  unintentional weight changes CARDIAC: No chest pain, no pressure/palpitations, no orthopnea RESPIRATORY: No  cough, No  shortness of breath/wheeze GASTROINTESTINAL: No nausea, no vomiting, (+)GERD as noted HPI, no blood in stool, no diarrhea, no constipation MUSCULOSKELETAL: No  myalgia/arthralgia GENITOURINARY: No incontinence, No abnormal genital bleeding/discharge NEUROLOGIC: No weakness, no dizziness, no slurred speech PSYCHIATRIC: (+) concerns with depression, (+) concerns with anxiety, no sleep problems    Exam:  BP 139/82 mmHg  Pulse 111  Ht 5\' 2"  (1.575 m)  Wt 255 lb (115.667 kg)  BMI 46.63 kg/m2  LMP 02/20/2014 Constitutional: VSS, see above. General Appearance: alert, well-developed, well-nourished, NAD Respiratory: Normal respiratory effort.  Psychiatric: Normal judgment/insight. Normal mood and affect. Oriented x3.      ASSESSMENT/PLAN: Increase Paxil. Patient is advised on side effects of medication, routine follow-up is important to determine medication safety, patientd eclines referral downstairs to behavioral health for counseling but advised if increased dose doesn't help I would consider med switch and likely insist on counselling.  GERD: pt opts  no PPI at this time, will try increase Zantac to BID and RTC if no better.   Generalized anxiety disorder - Plan: PARoxetine (PAXIL) 40 MG tablet, continue BuSpar and propranolol.   Anxiety state - Plan: PARoxetine (PAXIL) 40 MG tablet, busPIRone (BUSPAR) 30 MG tablet, propranolol ER (INDERAL LA) 60 MG 24 hr capsule  Gastroesophageal reflux disease without esophagitis   Return in about 4 weeks (around 09/19/2015) for if no better on increased dose Paxil, wil need appt to discuss meds. Otherwise call for refill. Marland Kitchen

## 2015-10-21 ENCOUNTER — Emergency Department (INDEPENDENT_AMBULATORY_CARE_PROVIDER_SITE_OTHER)
Admission: EM | Admit: 2015-10-21 | Discharge: 2015-10-21 | Disposition: A | Discharge status: Home / Self Care | Source: Home / Self Care | Attending: Family Medicine | Admitting: Family Medicine

## 2015-10-21 ENCOUNTER — Encounter: Payer: Self-pay | Admitting: Emergency Medicine

## 2015-10-21 DIAGNOSIS — R110 Nausea: Secondary | ICD-10-CM | POA: Diagnosis not present

## 2015-10-21 DIAGNOSIS — H8112 Benign paroxysmal vertigo, left ear: Secondary | ICD-10-CM

## 2015-10-21 MED ORDER — METOCLOPRAMIDE HCL 5 MG/ML IJ SOLN
10.0000 mg | Freq: Once | INTRAMUSCULAR | Status: AC
  Administered 2015-10-21: 10 mg via INTRAMUSCULAR

## 2015-10-21 MED ORDER — MECLIZINE HCL 25 MG PO TABS: 25.0000 mg | ORAL_TABLET | Freq: Three times a day (TID) | ORAL | Status: DC | PRN

## 2015-10-21 NOTE — ED Notes (Signed)
Reports onset of what she thought was inner ear dizziness 2 days ago; had progressive worsening of facial numbness, particularly around lips; now is also nauseated. Had similar episode about one year ago and was evaluated for possible CVA at [facility]; determined not CVA.

## 2015-10-21 NOTE — Discharge Instructions (Signed)
Begin clear liquids for about12 hours, then gradually advance diet as tolerated.   Benign Positional Vertigo Vertigo is the feeling that you or your surroundings are moving when they are not. Benign positional vertigo is the most common form of vertigo. The cause of this condition is not serious (is benign). This condition is triggered by certain movements and positions (is positional). This condition can be dangerous if it occurs while you are doing something that could endanger you or others, such as driving.  CAUSES In many cases, the cause of this condition is not known. It may be caused by a disturbance in an area of the inner ear that helps your brain to sense movement and balance. This disturbance can be caused by a viral infection (labyrinthitis), head injury, or repetitive motion. RISK FACTORS This condition is more likely to develop in:  Women.  People who are 35 years of age or older. SYMPTOMS Symptoms of this condition usually happen when you move your head or your eyes in different directions. Symptoms may start suddenly, and they usually last for less than a minute. Symptoms may include:  Loss of balance and falling.  Feeling like you are spinning or moving.  Feeling like your surroundings are spinning or moving.  Nausea and vomiting.  Blurred vision.  Dizziness.  Involuntary eye movement (nystagmus). Symptoms can be mild and cause only slight annoyance, or they can be severe and interfere with daily life. Episodes of benign positional vertigo may return (recur) over time, and they may be triggered by certain movements. Symptoms may improve over time. DIAGNOSIS This condition is usually diagnosed by medical history and a physical exam of the head, neck, and ears. You may be referred to a health care provider who specializes in ear, nose, and throat (ENT) problems (otolaryngologist) or a provider who specializes in disorders of the nervous system (neurologist). You may  have additional testing, including:  MRI.  A CT scan.  Eye movement tests. Your health care provider may ask you to change positions quickly while he or she watches you for symptoms of benign positional vertigo, such as nystagmus. Eye movement may be tested with an electronystagmogram (ENG), caloric stimulation, the Dix-Hallpike test, or the roll test.  An electroencephalogram (EEG). This records electrical activity in your brain.  Hearing tests. TREATMENT Usually, your health care provider will treat this by moving your head in specific positions to adjust your inner ear back to normal. Surgery may be needed in severe cases, but this is rare. In some cases, benign positional vertigo may resolve on its own in 2-4 weeks. HOME CARE INSTRUCTIONS Safety  Move slowly.Avoid sudden body or head movements.  Avoid driving.  Avoid operating heavy machinery.  Avoid doing any tasks that would be dangerous to you or others if a vertigo episode would occur.  If you have trouble walking or keeping your balance, try using a cane for stability. If you feel dizzy or unstable, sit down right away.  Return to your normal activities as told by your health care provider. Ask your health care provider what activities are safe for you. General Instructions  Take over-the-counter and prescription medicines only as told by your health care provider.  Avoid certain positions or movements as told by your health care provider.  Drink enough fluid to keep your urine clear or pale yellow.  Keep all follow-up visits as told by your health care provider. This is important. SEEK MEDICAL CARE IF:  You have a fever.  Your condition gets worse or you develop new symptoms.  Your family or friends notice any behavioral changes.  Your nausea or vomiting gets worse.  You have numbness or a "pins and needles" sensation. SEEK IMMEDIATE MEDICAL CARE IF:  You have difficulty speaking or moving.  You are  always dizzy.  You faint.  You develop severe headaches.  You have weakness in your legs or arms.  You have changes in your hearing or vision.  You develop a stiff neck.  You develop sensitivity to light.   This information is not intended to replace advice given to you by your health care provider. Make sure you discuss any questions you have with your health care provider.   Document Released: 04/30/2006 Document Revised: 04/13/2015 Document Reviewed: 11/15/2014 Elsevier Interactive Patient Education [employer].

## 2015-10-21 NOTE — ED Provider Notes (Signed)
CSN: [id number]     Arrival date & time 10/21/15  1806 History   First MD Initiated Contact with Patient 10/21/15 1830     Chief Complaint  Patient presents with  . Dizziness  . Numbness  . Nausea      HPI Comments: Patient developed dizziness (head "spinning") two days ago with nausea and intermittent facial numbness, especially around her lips.  She denies headache or other neurologic symptoms.  She had a similar episode about one year ago.  At that time she was evaluated at [facility] where an MRI of brain/head was negative.  She notes that she also has "migraine" headaches about 2 times per week.  Patient is a 30 y.o. female presenting with dizziness. The history is provided by the patient.  Dizziness Quality:  Head spinning and imbalance Severity:  Mild Onset quality:  Sudden Duration:  2 days Timing:  Constant Progression:  Waxing and waning Chronicity:  Recurrent Context: bending over, eye movement and head movement   Relieved by:  None tried Worsened by:  Movement Ineffective treatments:  None tried Associated symptoms: no headaches, no hearing loss, no nausea, no palpitations, no shortness of breath, no syncope, no tinnitus, no vision changes, no vomiting and no weakness     Past Medical History  Diagnosis Date  . Tachycardia   . Cancer ([place])     skin  . Fatty liver 07/04/2015  . Hyperlipidemia 07/04/2015    High triglycerides, low HDL, high VLDL   Past Surgical History  Procedure Laterality Date  . Tubal ligation    . Myringotomy    . Tear duct probing    . Breast enhancement surgery    . Abdominal hysterectomy     Family History  Problem Relation Age of Onset  . Diabetes Mother   . Hypertension Mother   . Cancer Mother     skin CA  . Hyperlipidemia Mother   . Diabetes Maternal Grandmother   . Cancer Maternal Grandfather   . Diabetes Maternal Grandfather   . Heart attack Maternal Grandfather   . Stroke Paternal Aunt    Social History   Substance Use Topics  . Smoking status: Current Every Day Smoker -- 1.00 packs/day for 12 years    Types: Cigarettes  . Smokeless tobacco: Never Used  . Alcohol Use: No     Comment: Rarely   OB History    No data available     Review of Systems  HENT: Negative for hearing loss and tinnitus.   Respiratory: Negative for shortness of breath.   Cardiovascular: Negative for palpitations and syncope.  Gastrointestinal: Negative for nausea and vomiting.  Neurological: Positive for dizziness. Negative for weakness and headaches.  All other systems reviewed and are negative.   Allergies  Bee venom; Morphine and related; and Sulfur  Home Medications   Prior to Admission medications   Medication Sig Start Date End Date Taking? Authorizing Provider  busPIRone (BUSPAR) 30 MG tablet Take 1 tablet (30 mg total) by mouth 2 (two) times daily. 08/22/15   Emeterio Reeve, DO  dicyclomine (BENTYL) 10 MG capsule Take 1 capsule (10 mg total) by mouth 3 (three) times daily as needed for spasms. 07/04/15   Emeterio Reeve, DO  meclizine (ANTIVERT) 25 MG tablet Take 1 tablet (25 mg total) by mouth 3 (three) times daily as needed for dizziness. 10/21/15   Kandra Nicolas, MD  PARoxetine (PAXIL) 40 MG tablet Take 1 tablet (40 mg total) by mouth  daily. FOLLOW UP APPOINTMENT NEEDED FOR FURTHER REFILLS 08/22/15   Emeterio Reeve, DO  propranolol ER (INDERAL LA) 60 MG 24 hr capsule Take 1 capsule (60 mg total) by mouth daily. FOLLOW UP APPOINTMENT NEEDED FOR FURTHER REFILLS 08/22/15   Emeterio Reeve, DO  ranitidine (ZANTAC) 150 MG tablet Take 150 mg by mouth daily.    Historical Provider, MD   Meds Ordered and Administered this Visit   Medications  metoCLOPramide (REGLAN) injection 10 mg (10 mg Intramuscular Given 10/21/15 1908)    BP 128/65 mmHg  Pulse 107  Temp(Src) 98 F (36.7 C) (Oral)  Resp 16  Ht 5\' 2"  (1.575 m)  Wt 264 lb (119.75 kg)  BMI 48.27 kg/m2  SpO2 98%  LMP 02/20/2014 No data  found.   Physical Exam Nursing notes and Vital Signs reviewed. Appearance:  Patient appears stated age, and in no acute distress.  Patient is obese (BMI 48.3) Eyes:  Pupils are equal, round, and reactive to light and accomodation.  Extraocular movement is intact.  Conjunctivae are not inflamed.  Fundi benign.  No photophobia.  Nystagmus present to left. Ears:  Canals normal.  Tympanic membranes normal.  Nose:   Normal turbinates.  No sinus tenderness.   Pharynx:  Normal Neck:  Supple.  No adenopathy.  Carotids have normal upstrokes without bruits Lungs:  Clear to auscultation.  Breath sounds are equal.  Moving air well. Heart:  Regular rate and rhythm without murmurs, rubs, or gallops.  Abdomen:  Nontender without masses or hepatosplenomegaly.  Bowel sounds are present.  No CVA or flank tenderness.  Extremities:  No edema.  Skin:  No rash present.   Neurologic:  Cranial nerves 2 through 12 are normal.  Patellar, achilles, and elbow reflexes are normal.  Cerebellar function is intact (finger-to-nose and rapid alternating hand movement).  Gait and station are normal.    ED Course  Procedures none  MDM   1. Benign paroxysmal positional vertigo, left   2. Nausea without vomiting    Administered Reglan 10mg  IM Rx for Antivert. Begin clear liquids for about12 hours, then gradually advance diet as tolerated. Recommend followup evaluation by neurologist.    Kandra Nicolas, MD 10/25/15 1630

## 2015-12-09 ENCOUNTER — Other Ambulatory Visit: Payer: Self-pay | Admitting: Osteopathic Medicine

## 2015-12-20 ENCOUNTER — Ambulatory Visit: Admitting: Osteopathic Medicine

## 2015-12-20 DIAGNOSIS — Z91199 Patient's noncompliance with other medical treatment and regimen due to unspecified reason: Secondary | ICD-10-CM | POA: Insufficient documentation

## 2015-12-20 DIAGNOSIS — Z5329 Procedure and treatment not carried out because of patient's decision for other reasons: Secondary | ICD-10-CM | POA: Insufficient documentation

## 2016-02-19 ENCOUNTER — Encounter: Payer: Self-pay | Admitting: Emergency Medicine

## 2016-02-19 ENCOUNTER — Emergency Department (INDEPENDENT_AMBULATORY_CARE_PROVIDER_SITE_OTHER)
Admission: EM | Admit: 2016-02-19 | Discharge: 2016-02-19 | Disposition: A | Discharge status: Home / Self Care | Source: Home / Self Care | Attending: Family Medicine | Admitting: Family Medicine

## 2016-02-19 DIAGNOSIS — L089 Local infection of the skin and subcutaneous tissue, unspecified: Secondary | ICD-10-CM

## 2016-02-19 DIAGNOSIS — S81852A Open bite, left lower leg, initial encounter: Secondary | ICD-10-CM

## 2016-02-19 MED ORDER — HYDROCODONE-ACETAMINOPHEN 5-325 MG PO TABS: 1.0000 | ORAL_TABLET | Freq: Four times a day (QID) | ORAL | Status: DC | PRN

## 2016-02-19 MED ORDER — DOXYCYCLINE HYCLATE 100 MG PO CAPS: 100.0000 mg | ORAL_CAPSULE | Freq: Two times a day (BID) | ORAL | Status: DC

## 2016-02-19 MED ORDER — HYDROXYZINE HCL 25 MG PO TABS: 25.0000 mg | ORAL_TABLET | Freq: Four times a day (QID) | ORAL | Status: DC | PRN

## 2016-02-19 NOTE — Discharge Instructions (Signed)
Norco/Vicodin (hydrocodone-acetaminophen) is a narcotic pain medication, do not combine these medications with others containing tylenol. While taking, do not drink alcohol, drive, or perform any other activities that requires focus while taking these medications.   Atarax (hydroxizine) is an antihistamine that can be taken to help with itching. This medication can cause drowsiness so do not drive or drink alcohol while taking.     Cellulitis Cellulitis is an infection of the skin and the tissue under the skin. The infected area is usually red and tender. This happens most often in the arms and lower legs. HOME CARE   Take your antibiotic medicine as told. Finish the medicine even if you start to feel better.  Keep the infected arm or leg raised (elevated).  Put a warm cloth on the area up to 4 times per day.  Only take medicines as told by your doctor.  Keep all doctor visits as told. GET HELP IF:  You see red streaks on the skin coming from the infected area.  Your red area gets bigger or turns a dark color.  Your bone or joint under the infected area is painful after the skin heals.  Your infection comes back in the same area or different area.  You have a puffy (swollen) bump in the infected area.  You have new symptoms.  You have a fever. GET HELP RIGHT AWAY IF:   You feel very sleepy.  You throw up (vomit) or have watery poop (diarrhea).  You feel sick and have muscle aches and pains.   This information is not intended to replace advice given to you by your health care provider. Make sure you discuss any questions you have with your health care provider.   Document Released: 01/09/2008 Document Revised: 04/13/2015 Document Reviewed: 10/08/2011 Elsevier Interactive Patient Education [employer].

## 2016-02-19 NOTE — ED Notes (Signed)
Patient presents to [facility] with C/O some type of bite noted to the right lower leg nears the ankle.States that she was walking through high grass and felt a pinch on Thursday. Patient has redness and slight edema patient has marks noted with a raised area to the posterior aspect of the right lower leg. C/O itching a pain a 7/10

## 2016-02-19 NOTE — ED Provider Notes (Signed)
CSN: [id number]     Arrival date & time 02/19/16  1219 History   First MD Initiated Contact with Patient 02/19/16 1250     Chief Complaint  Patient presents with  . Ankle Pain   (Consider location/radiation/quality/duration/timing/severity/associated sxs/prior Treatment) HPI  Robin Melton is a 30 y.o. female presenting to [facility] with c/o gradually worsening moderately itchy bite to her Right lower leg.  She states she was walking through high grass and felt a pinch 4 days ago. She noticed 2 puncture wounds and mild redness that has gradually worsened along with swelling that started yesterday around the bite. Pt concerned she was bit by a snake. She has been taking benadryl for the itching, and Advil and Aleve for pain w/o relief. Pain is 7/10, aching and sore. Denies fever, chills, n/v/d, or SOB.   Past Medical History  Diagnosis Date  . Tachycardia   . Cancer ([place])     skin  . Fatty liver 07/04/2015  . Hyperlipidemia 07/04/2015    High triglycerides, low HDL, high VLDL   Past Surgical History  Procedure Laterality Date  . Tubal ligation    . Myringotomy    . Tear duct probing    . Breast enhancement surgery    . Abdominal hysterectomy     Family History  Problem Relation Age of Onset  . Diabetes Mother   . Hypertension Mother   . Cancer Mother     skin CA  . Hyperlipidemia Mother   . Diabetes Maternal Grandmother   . Cancer Maternal Grandfather   . Diabetes Maternal Grandfather   . Heart attack Maternal Grandfather   . Stroke Paternal Aunt    Social History  Substance Use Topics  . Smoking status: Current Every Day Smoker -- 1.00 packs/day for 12 years    Types: Cigarettes  . Smokeless tobacco: Never Used  . Alcohol Use: No     Comment: Rarely   OB History    No data available     Review of Systems  Constitutional: Negative for fever and chills.  Respiratory: Negative for chest tightness and shortness of breath.   Gastrointestinal: Negative for nausea and  vomiting.  Musculoskeletal: Positive for myalgias, joint swelling and arthralgias.  Skin: Positive for color change, rash and wound. Negative for pallor.  Neurological: Negative for weakness and numbness.    Allergies  Bee venom; Morphine and related; and Sulfur  Home Medications   Prior to Admission medications   Medication Sig Start Date End Date Taking? Authorizing Provider  busPIRone (BUSPAR) 30 MG tablet Take 1 tablet (30 mg total) by mouth 2 (two) times daily. 08/22/15   Emeterio Reeve, DO  dicyclomine (BENTYL) 10 MG capsule Take 1 capsule (10 mg total) by mouth 3 (three) times daily as needed for spasms. 07/04/15   Emeterio Reeve, DO  doxycycline (VIBRAMYCIN) 100 MG capsule Take 1 capsule (100 mg total) by mouth 2 (two) times daily. One po bid x 7 days 02/19/16   Noland Fordyce, PA-C  HYDROcodone-acetaminophen (NORCO/VICODIN) 5-325 MG tablet Take 1-2 tablets by mouth every 6 (six) hours as needed for moderate pain or severe pain. 02/19/16   Noland Fordyce, PA-C  hydrOXYzine (ATARAX/VISTARIL) 25 MG tablet Take 1 tablet (25 mg total) by mouth every 6 (six) hours as needed for itching. 02/19/16   Noland Fordyce, PA-C  meclizine (ANTIVERT) 25 MG tablet Take 1 tablet (25 mg total) by mouth 3 (three) times daily as needed for dizziness. 10/21/15   Kandra Nicolas,  MD  PARoxetine (PAXIL) 40 MG tablet take 1 tablet by mouth once daily FOLLOW UP APPOINTMENT NEEDED FOR FURTHER REFILLS 12/09/15   Emeterio Reeve, DO  propranolol ER (INDERAL LA) 60 MG 24 hr capsule Take 1 capsule (60 mg total) by mouth daily. FOLLOW UP APPOINTMENT NEEDED FOR FURTHER REFILLS 08/22/15   Emeterio Reeve, DO  ranitidine (ZANTAC) 150 MG tablet Take 150 mg by mouth daily.    Historical Provider, MD   Meds Ordered and Administered this Visit  Medications - No data to display  BP 119/82 mmHg  Pulse 110  Temp(Src) 98.2 F (36.8 C) (Oral)  Resp 17  Ht 5\' 2"  (1.575 m)  Wt 267 lb (121.11 kg)  BMI 48.82 kg/m2  SpO2  96%  LMP 02/20/2014 No data found.   Physical Exam  Constitutional: She is oriented to person, place, and time. She appears well-developed and well-nourished.  HENT:  Head: Normocephalic and atraumatic.  Eyes: EOM are normal.  Neck: Normal range of motion.  Cardiovascular: Normal rate.   Pulmonary/Chest: Effort normal.  Musculoskeletal: Normal range of motion. She exhibits edema and tenderness.  Right lower leg: tenderness and localized edema to lateral aspect (see skin exam)  Neurological: She is alert and oriented to person, place, and time.  Skin: Skin is warm and dry. There is erythema.  Right lower leg, lateral aspect: two puncture wounds with 1cm surrounding edema with tenderness surrounding by 3cm of erythema. No active bleeding or discharge.  Psychiatric: She has a normal mood and affect. Her behavior is normal.  Nursing note and vitals reviewed.   ED Course  Procedures (including critical care time)  Labs Review Labs Reviewed - No data to display  Imaging Review No results found.   MDM   1. Infected bite of lower leg, left, initial encounter    Pt presenting with worsening pain, redness, itching and swelling to possible snake bite on Right lower leg.   No evidence of anaphylaxis.  No indication for I&D at this time. Will treat for skin infection.  Rx: Doxycycline (denies concern for pregnancy, has had hysterectomy), atarax, and norco. Encouraged warm compresses, elevate, and keep wound clean with soap and water. F/u in 4-5 days if not improving, sooner if worsening. Patient verbalized understanding and agreement with treatment plan.      Noland Fordyce, PA-C 02/19/16 1501

## 2016-03-06 ENCOUNTER — Emergency Department (INDEPENDENT_AMBULATORY_CARE_PROVIDER_SITE_OTHER)
Admission: EM | Admit: 2016-03-06 | Discharge: 2016-03-06 | Disposition: A | Discharge status: Home / Self Care | Source: Home / Self Care | Attending: Family Medicine | Admitting: Family Medicine

## 2016-03-06 ENCOUNTER — Emergency Department (INDEPENDENT_AMBULATORY_CARE_PROVIDER_SITE_OTHER)

## 2016-03-06 ENCOUNTER — Encounter: Payer: Self-pay | Admitting: *Deleted

## 2016-03-06 DIAGNOSIS — R05 Cough: Secondary | ICD-10-CM

## 2016-03-06 DIAGNOSIS — R0989 Other specified symptoms and signs involving the circulatory and respiratory systems: Secondary | ICD-10-CM | POA: Diagnosis not present

## 2016-03-06 DIAGNOSIS — J209 Acute bronchitis, unspecified: Secondary | ICD-10-CM

## 2016-03-06 DIAGNOSIS — R062 Wheezing: Secondary | ICD-10-CM

## 2016-03-06 DIAGNOSIS — R059 Cough, unspecified: Secondary | ICD-10-CM

## 2016-03-06 DIAGNOSIS — F17200 Nicotine dependence, unspecified, uncomplicated: Secondary | ICD-10-CM

## 2016-03-06 MED ORDER — IPRATROPIUM-ALBUTEROL 0.5-2.5 (3) MG/3ML IN SOLN
3.0000 mL | Freq: Once | RESPIRATORY_TRACT | Status: AC
  Administered 2016-03-06: 3 mL via RESPIRATORY_TRACT

## 2016-03-06 MED ORDER — PREDNISONE 20 MG PO TABS: ORAL_TABLET | ORAL | 0 refills | Status: DC

## 2016-03-06 MED ORDER — GUAIFENESIN ER 600 MG PO TB12: 600.0000 mg | ORAL_TABLET | Freq: Two times a day (BID) | ORAL | 0 refills | Status: DC | PRN

## 2016-03-06 MED ORDER — DEXAMETHASONE SODIUM PHOSPHATE 10 MG/ML IJ SOLN
10.0000 mg | Freq: Once | INTRAMUSCULAR | Status: AC
  Administered 2016-03-06: 10 mg via INTRAMUSCULAR

## 2016-03-06 MED ORDER — ALBUTEROL SULFATE HFA 108 (90 BASE) MCG/ACT IN AERS: 1.0000 | INHALATION_SPRAY | RESPIRATORY_TRACT | 0 refills | Status: DC | PRN

## 2016-03-06 NOTE — ED Provider Notes (Signed)
CSN: [id number]     Arrival date & time 03/06/16  0825 History   First MD Initiated Contact with Patient 03/06/16 [id number]     Chief Complaint  Patient presents with  . Cough   (Consider location/radiation/quality/duration/timing/severity/associated sxs/prior Treatment) HPI Robin Melton is a 30 y.o. female presenting to UC with c/o cough, chest soreness and burning "in her bronchioles" and congestion that started yesterday.  She has tried OTC cough/cold w/o relief. She notes she gets a similar cough about twice a year. She is a daily smoker, about 1.5 packs per day but has not smoked for the last 2 days because of her symptoms. Denies fever, chills, n/v/d. Denies sick contacts or recent travel. She has had an inhaler and prednisone in the past, both do help.    Past Medical History:  Diagnosis Date  . Cancer ([place])    skin  . Fatty liver 07/04/2015  . Hyperlipidemia 07/04/2015   High triglycerides, low HDL, high VLDL  . Tachycardia    Past Surgical History:  Procedure Laterality Date  . ABDOMINAL HYSTERECTOMY    . BREAST ENHANCEMENT SURGERY    . MYRINGOTOMY    . TEAR DUCT PROBING    . TUBAL LIGATION     Family History  Problem Relation Age of Onset  . Diabetes Mother   . Hypertension Mother   . Cancer Mother     skin CA  . Hyperlipidemia Mother   . Diabetes Maternal Grandmother   . Cancer Maternal Grandfather   . Diabetes Maternal Grandfather   . Heart attack Maternal Grandfather   . Stroke Paternal Aunt    Social History  Substance Use Topics  . Smoking status: Current Every Day Smoker    Packs/day: 1.00    Years: 12.00    Types: Cigarettes  . Smokeless tobacco: Never Used  . Alcohol use No     Comment: Rarely   OB History    No data available     Review of Systems  Constitutional: Negative for appetite change, chills and fever.  HENT: Positive for congestion. Negative for ear pain, sinus pressure, sneezing and sore throat.   Respiratory: Positive for cough  and chest tightness. Negative for shortness of breath and wheezing.   Cardiovascular: Positive for chest pain ( "burning"). Negative for palpitations.  Gastrointestinal: Negative for abdominal pain, diarrhea, nausea and vomiting.  Musculoskeletal: Negative for arthralgias and myalgias.  Skin: Negative for color change and rash.    Allergies  Bee venom; Morphine and related; and Sulfur  Home Medications   Prior to Admission medications   Medication Sig Start Date End Date Taking? Authorizing Provider  albuterol (PROVENTIL HFA;VENTOLIN HFA) 108 (90 Base) MCG/ACT inhaler Inhale 1-2 puffs into the lungs every 4 (four) hours as needed for wheezing or shortness of breath. 03/06/16   Noland Fordyce, PA-C  busPIRone (BUSPAR) 30 MG tablet Take 1 tablet (30 mg total) by mouth 2 (two) times daily. 08/22/15   Emeterio Reeve, DO  dicyclomine (BENTYL) 10 MG capsule Take 1 capsule (10 mg total) by mouth 3 (three) times daily as needed for spasms. 07/04/15   Emeterio Reeve, DO  guaiFENesin (MUCINEX) 600 MG 12 hr tablet Take 1 tablet (600 mg total) by mouth 2 (two) times daily as needed for cough or to loosen phlegm. Take with full glass of water 03/06/16   Noland Fordyce, PA-C  hydrOXYzine (ATARAX/VISTARIL) 25 MG tablet Take 1 tablet (25 mg total) by mouth every 6 (six) hours as needed for  itching. 02/19/16   Noland Fordyce, PA-C  meclizine (ANTIVERT) 25 MG tablet Take 1 tablet (25 mg total) by mouth 3 (three) times daily as needed for dizziness. 10/21/15   Kandra Nicolas, MD  PARoxetine (PAXIL) 40 MG tablet take 1 tablet by mouth once daily FOLLOW UP APPOINTMENT NEEDED FOR FURTHER REFILLS 12/09/15   Emeterio Reeve, DO  predniSONE (DELTASONE) 20 MG tablet 3 tabs po day one, then 2 po daily x 4 days 03/06/16   Noland Fordyce, PA-C  propranolol ER (INDERAL LA) 60 MG 24 hr capsule Take 1 capsule (60 mg total) by mouth daily. FOLLOW UP APPOINTMENT NEEDED FOR FURTHER REFILLS 08/22/15   Emeterio Reeve, DO  ranitidine  (ZANTAC) 150 MG tablet Take 150 mg by mouth daily.    Historical Provider, MD   Meds Ordered and Administered this Visit   Medications  ipratropium-albuterol (DUONEB) 0.5-2.5 (3) MG/3ML nebulizer solution 3 mL (3 mLs Nebulization Given 03/06/16 0937)  dexamethasone (DECADRON) injection 10 mg (10 mg Intramuscular Given 03/06/16 0941)    BP 136/84 (BP Location: Left Arm)   Pulse 108   Temp 98.7 F (37.1 C) (Oral)   Resp 18   Ht 5\' 2"  (1.575 m)   Wt 264 lb (119.7 kg)   LMP 02/20/2014   SpO2 97%   BMI 48.29 kg/m  No data found.   Physical Exam  Constitutional: She appears well-developed and well-nourished. No distress.  HENT:  Head: Normocephalic and atraumatic.  Right Ear: Tympanic membrane normal.  Left Ear: Tympanic membrane normal.  Nose: Nose normal.  Mouth/Throat: Uvula is midline and mucous membranes are normal. Posterior oropharyngeal erythema present. No oropharyngeal exudate, posterior oropharyngeal edema or tonsillar abscesses.  Eyes: Conjunctivae are normal. No scleral icterus.  Neck: Normal range of motion.  Cardiovascular: Normal rate, regular rhythm and normal heart sounds.   Pulmonary/Chest: Effort normal. No respiratory distress. She has wheezes (faint, expiratory wheeze). She has no rales.  Abdominal: Soft. She exhibits no distension. There is no tenderness.  Musculoskeletal: Normal range of motion.  Neurological: She is alert.  Skin: Skin is warm and dry. She is not diaphoretic.  Nursing note and vitals reviewed.   Urgent Care Course   Clinical Course    Procedures (including critical care time)  Labs Review Labs Reviewed - No data to display  Imaging Review Dg Chest 2 View  Result Date: 03/06/2016 CLINICAL DATA:  Cough and congestion for 2 days EXAM: CHEST  2 VIEW COMPARISON:  June 14, 2015 FINDINGS: There is no edema or consolidation. Heart size and pulmonary vascularity are normal. No adenopathy. Bony structures appear intact. IMPRESSION: No  edema or consolidation. Electronically Signed   By: Lowella Grip III M.D.   On: 03/06/2016 09:19    MDM   1. Cough   2. Wheeze   3. Current every day smoker   4. Acute bronchitis, unspecified organism    Pt c/o burning in chest with cough and congestion, wheeze on exam.  Symptoms started yesterday. No fever, chills, n/v/d.  O2 Sat 97% on RA  CXR: no edema or consolidation.  Duoneb given in UC: faint diffuse expiratory wheeze still present. Will give Decadron 10mg  IM  Rx: prednisone (to start tomorrow with breakfast), mucinex and albuterol inhaler.  Encouraged f/u with PCP in 5-7 days if not improving, sooner if worsening. Patient verbalized understanding and agreement with treatment plan.      Noland Fordyce, PA-C 03/06/16 [phone]

## 2016-03-06 NOTE — ED Triage Notes (Signed)
Pt c/o productive cough and chest pain x 1 day. Denies fever.

## 2017-04-04 ENCOUNTER — Emergency Department (INDEPENDENT_AMBULATORY_CARE_PROVIDER_SITE_OTHER)
Admission: EM | Admit: 2017-04-04 | Discharge: 2017-04-04 | Disposition: A | Discharge status: Home / Self Care | Source: Home / Self Care

## 2017-04-04 ENCOUNTER — Encounter: Payer: Self-pay | Admitting: Emergency Medicine

## 2017-04-04 DIAGNOSIS — M7751 Other enthesopathy of right foot: Secondary | ICD-10-CM | POA: Diagnosis not present

## 2017-04-04 MED ORDER — MELOXICAM 7.5 MG PO TABS: 7.5000 mg | ORAL_TABLET | Freq: Every day | ORAL | 0 refills | Status: DC

## 2017-04-04 MED ORDER — KETOROLAC TROMETHAMINE 30 MG/ML IJ SOLN
30.0000 mg | Freq: Once | INTRAMUSCULAR | Status: AC
  Administered 2017-04-04: 30 mg via INTRAMUSCULAR

## 2017-04-04 MED ORDER — DICLOFENAC SODIUM 1 % TD GEL: 2.0000 g | Freq: Four times a day (QID) | TRANSDERMAL | 0 refills | Status: DC

## 2017-04-04 NOTE — Discharge Instructions (Signed)
You were given a Toradol injection in office. Start mobic as directed. Discontinue ibuprofen while on Mobic. You can take tylenol as needed if pain is not well controlled. Voltaren gel on affected area. Ice compress and elevation. Foot compress during activity. Supportive shoes. Follow up with orthopedics for further evaluation and treatment needed.  Monitor for any worsening of symptoms, increased swelling, numbness/tingling, spreading redness, increased erythema, follow up here or with PCP for reevaluation.

## 2017-04-04 NOTE — ED Provider Notes (Signed)
[facility]    CSN: [id number] Arrival date & time: 04/04/17  1813     History   Chief Complaint Chief Complaint  Patient presents with  . Foot Pain    HPI Guynell Kleiber is a 31 y.o. female.   31 year old female comes in for 3 day history of right lateral foot swelling and pain. Patient states she recently started a new job, that requires long hours of walking and standing. She has been taking ibuprofen without relief. She was recently referred to orthopedics for knee problems, and was fitted by the orthopedics for supportive insoles, which she is waiting for arrival. She denies injury. States she has some numbness/tingling on dorsal aspect of the foot. Most painful on bearing weight.       Past Medical History:  Diagnosis Date  . Cancer ([place])    skin  . Fatty liver 07/04/2015  . Hyperlipidemia 07/04/2015   High triglycerides, low HDL, high VLDL  . Tachycardia     Patient Active Problem List   Diagnosis Date Noted  . Failure to attend appointment 12/20/2015  . GERD (gastroesophageal reflux disease) 08/22/2015  . Obesity 07/04/2015  . Fatty liver 07/04/2015  . Hyperlipidemia 07/04/2015  . Generalized anxiety disorder 06/28/2015  . Anxiety state 06/28/2015  . Left upper quadrant pain 06/28/2015  . Right upper quadrant pain 06/28/2015  . Tobacco abuse 12/16/2013  . Tachycardia   . Chest pain     Past Surgical History:  Procedure Laterality Date  . ABDOMINAL HYSTERECTOMY    . BREAST ENHANCEMENT SURGERY    . MYRINGOTOMY    . TEAR DUCT PROBING    . TUBAL LIGATION      OB History    No data available       Home Medications    Prior to Admission medications   Medication Sig Start Date End Date Taking? Authorizing Provider  diclofenac sodium (VOLTAREN) 1 % GEL Apply 2 g topically 4 (four) times daily. 04/04/17   Tasia Catchings, Mckinlee Dunk V, PA-C  meloxicam (MOBIC) 7.5 MG tablet Take 1 tablet (7.5 mg total) by mouth daily. 04/04/17   Ok Edwards, PA-C     Family History Family History  Problem Relation Age of Onset  . Diabetes Mother   . Hypertension Mother   . Cancer Mother        skin CA  . Hyperlipidemia Mother   . Diabetes Maternal Grandmother   . Cancer Maternal Grandfather   . Diabetes Maternal Grandfather   . Heart attack Maternal Grandfather   . Stroke Paternal Aunt     Social History Social History  Substance Use Topics  . Smoking status: Current Every Day Smoker    Packs/day: 1.00    Years: 12.00    Types: Cigarettes  . Smokeless tobacco: Never Used  . Alcohol use No     Comment: Rarely     Allergies   Bee venom; Morphine and related; and Sulfur   Review of Systems Review of Systems  Reason unable to perform ROS: See HPI as above.     Physical Exam Triage Vital Signs ED Triage Vitals  Enc Vitals Group     BP 04/04/17 1827 115/66     Pulse Rate 04/04/17 1827 93     Resp --      Temp 04/04/17 1827 97.8 F (36.6 C)     Temp Source 04/04/17 1827 Oral     SpO2 04/04/17 1827 98 %  Weight 04/04/17 1827 240 lb (108.9 kg)     Height 04/04/17 1827 5\' 2"  (1.575 m)     Head Circumference --      Peak Flow --      Pain Score 04/04/17 1828 8     Pain Loc --      Pain Edu? --      Excl. in [place]? --    No data found.   Updated Vital Signs BP 115/66 (BP Location: Left Arm)   Pulse 93   Temp 97.8 F (36.6 C) (Oral)   Ht 5\' 2"  (1.575 m)   Wt 240 lb (108.9 kg)   LMP 02/20/2014   SpO2 98%   BMI 43.90 kg/m   Visual Acuity Right Eye Distance:   Left Eye Distance:   Bilateral Distance:    Right Eye Near:   Left Eye Near:    Bilateral Near:     Physical Exam  Constitutional: She is oriented to person, place, and time. She appears well-developed and well-nourished. No distress.  HENT:  Head: Normocephalic and atraumatic.  Eyes: Pupils are equal, round, and reactive to light. Conjunctivae are normal.  Musculoskeletal:  Swelling noted on right lateral foot. Tenderness on palpation of dorsal  aspect of foot from 3rd to 4th metatarsals. Full range of motion. Strength normal and equal bilaterally. Sensation intact and equal bilaterally. Pedal pulses 2+ and equal bilaterally.   Neurological: She is alert and oriented to person, place, and time.  Skin: Skin is warm and dry.     UC Treatments / Results  Labs (all labs ordered are listed, but only abnormal results are displayed) Labs Reviewed - No data to display  EKG  EKG Interpretation None       Radiology No results found.  Procedures Procedures (including critical care time)  Medications Ordered in UC Medications  ketorolac (TORADOL) 30 MG/ML injection 30 mg (30 mg Intramuscular Given 04/04/17 1858)     Initial Impression / Assessment and Plan / UC Course  I have reviewed the triage vital signs and the nursing notes.  Pertinent labs & imaging results that were available during my care of the patient were reviewed by me and considered in my medical decision making (see chart for details).    Discussed with patient history and exam most consistent with tendinitis. Start NSAID as directed. Voltaren gel as needed on affected area. Ice compress and elevation. Compression during activity. Follow up with orthopedics if symptoms does not improve for further evaluation and treatment. Return precautions given.   Final Clinical Impressions(s) / UC Diagnoses   Final diagnoses:  Tendinitis of right foot    New Prescriptions Discharge Medication List as of 04/04/2017  6:52 PM    START taking these medications   Details  diclofenac sodium (VOLTAREN) 1 % GEL Apply 2 g topically 4 (four) times daily., Starting Thu 04/04/2017, Normal    meloxicam (MOBIC) 7.5 MG tablet Take 1 tablet (7.5 mg total) by mouth daily., Starting Thu 04/04/2017, Normal          Ok Edwards, PA-C 04/04/17 1946

## 2017-04-04 NOTE — ED Triage Notes (Signed)
Rt foot pain x 3 days swollen and painful, denies injury

## 2017-04-15 IMAGING — CR DG CHEST 2V
2 series · 2 of 2 positions shown · non-contrast
Comparison: None.

CLINICAL DATA: Patient with cough for 1 month.

EXAM:
CHEST  2 VIEW

[chest pa]
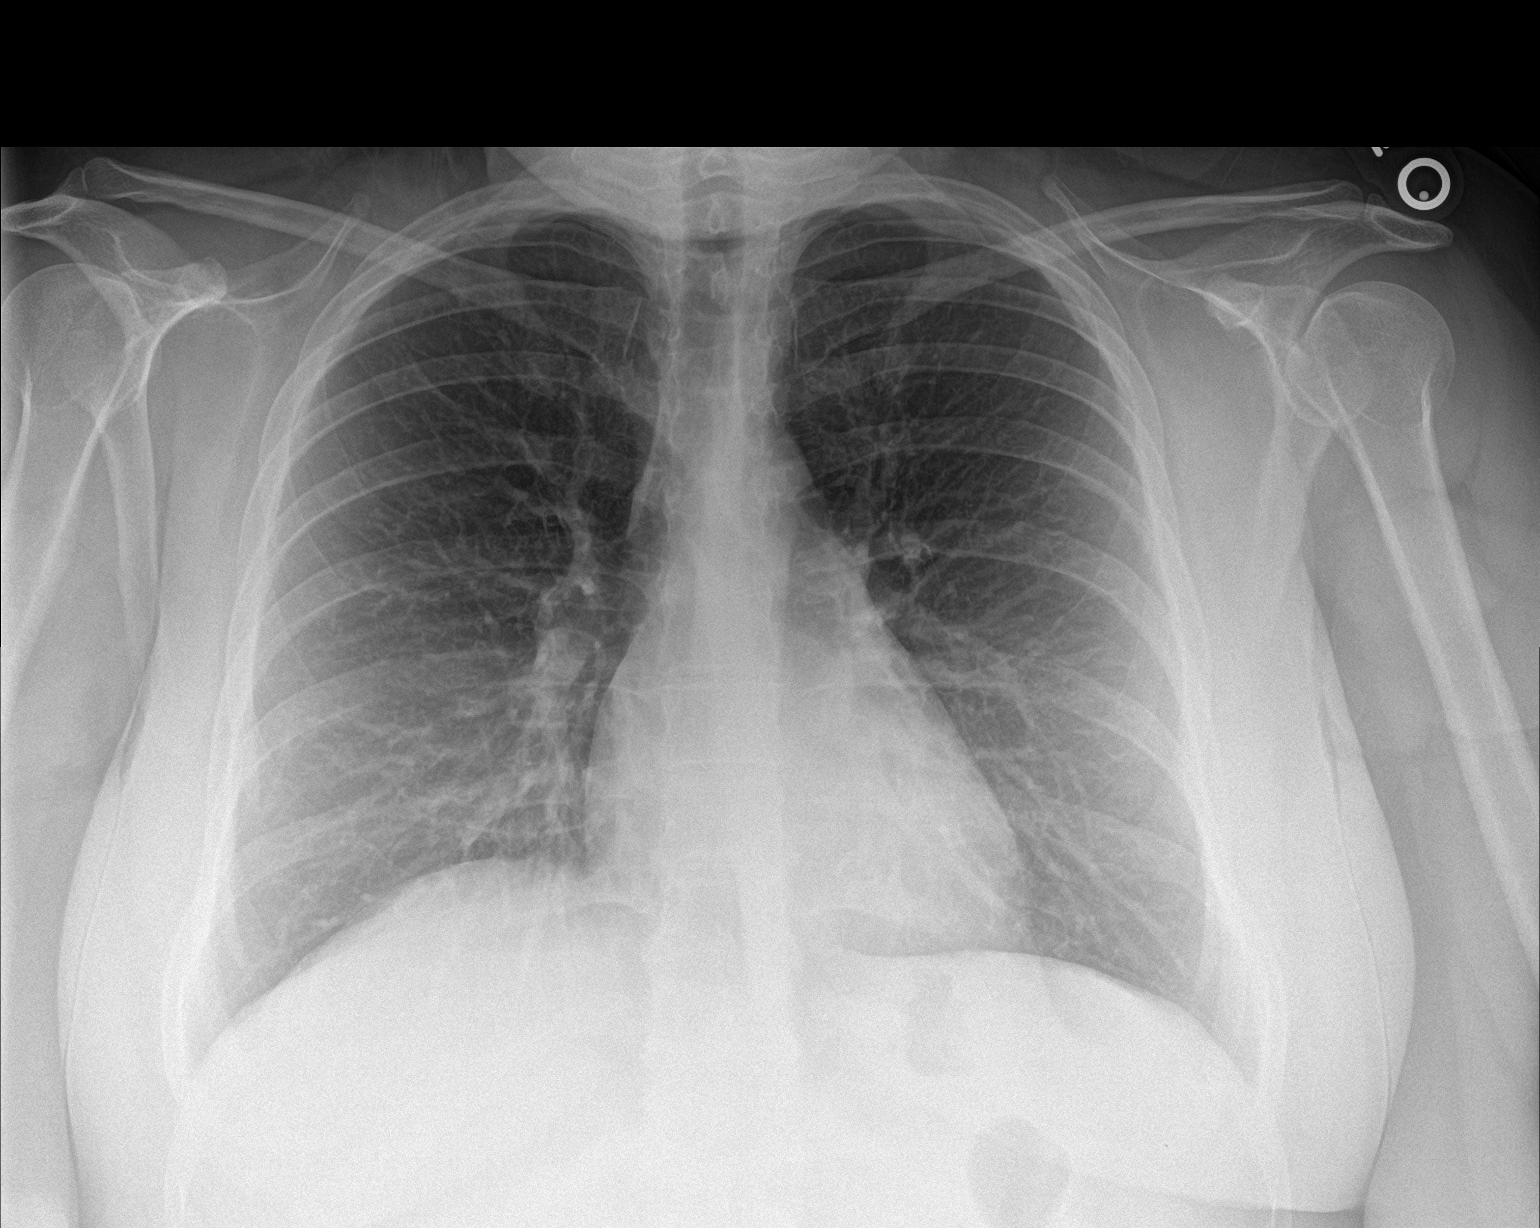

[chest lat]
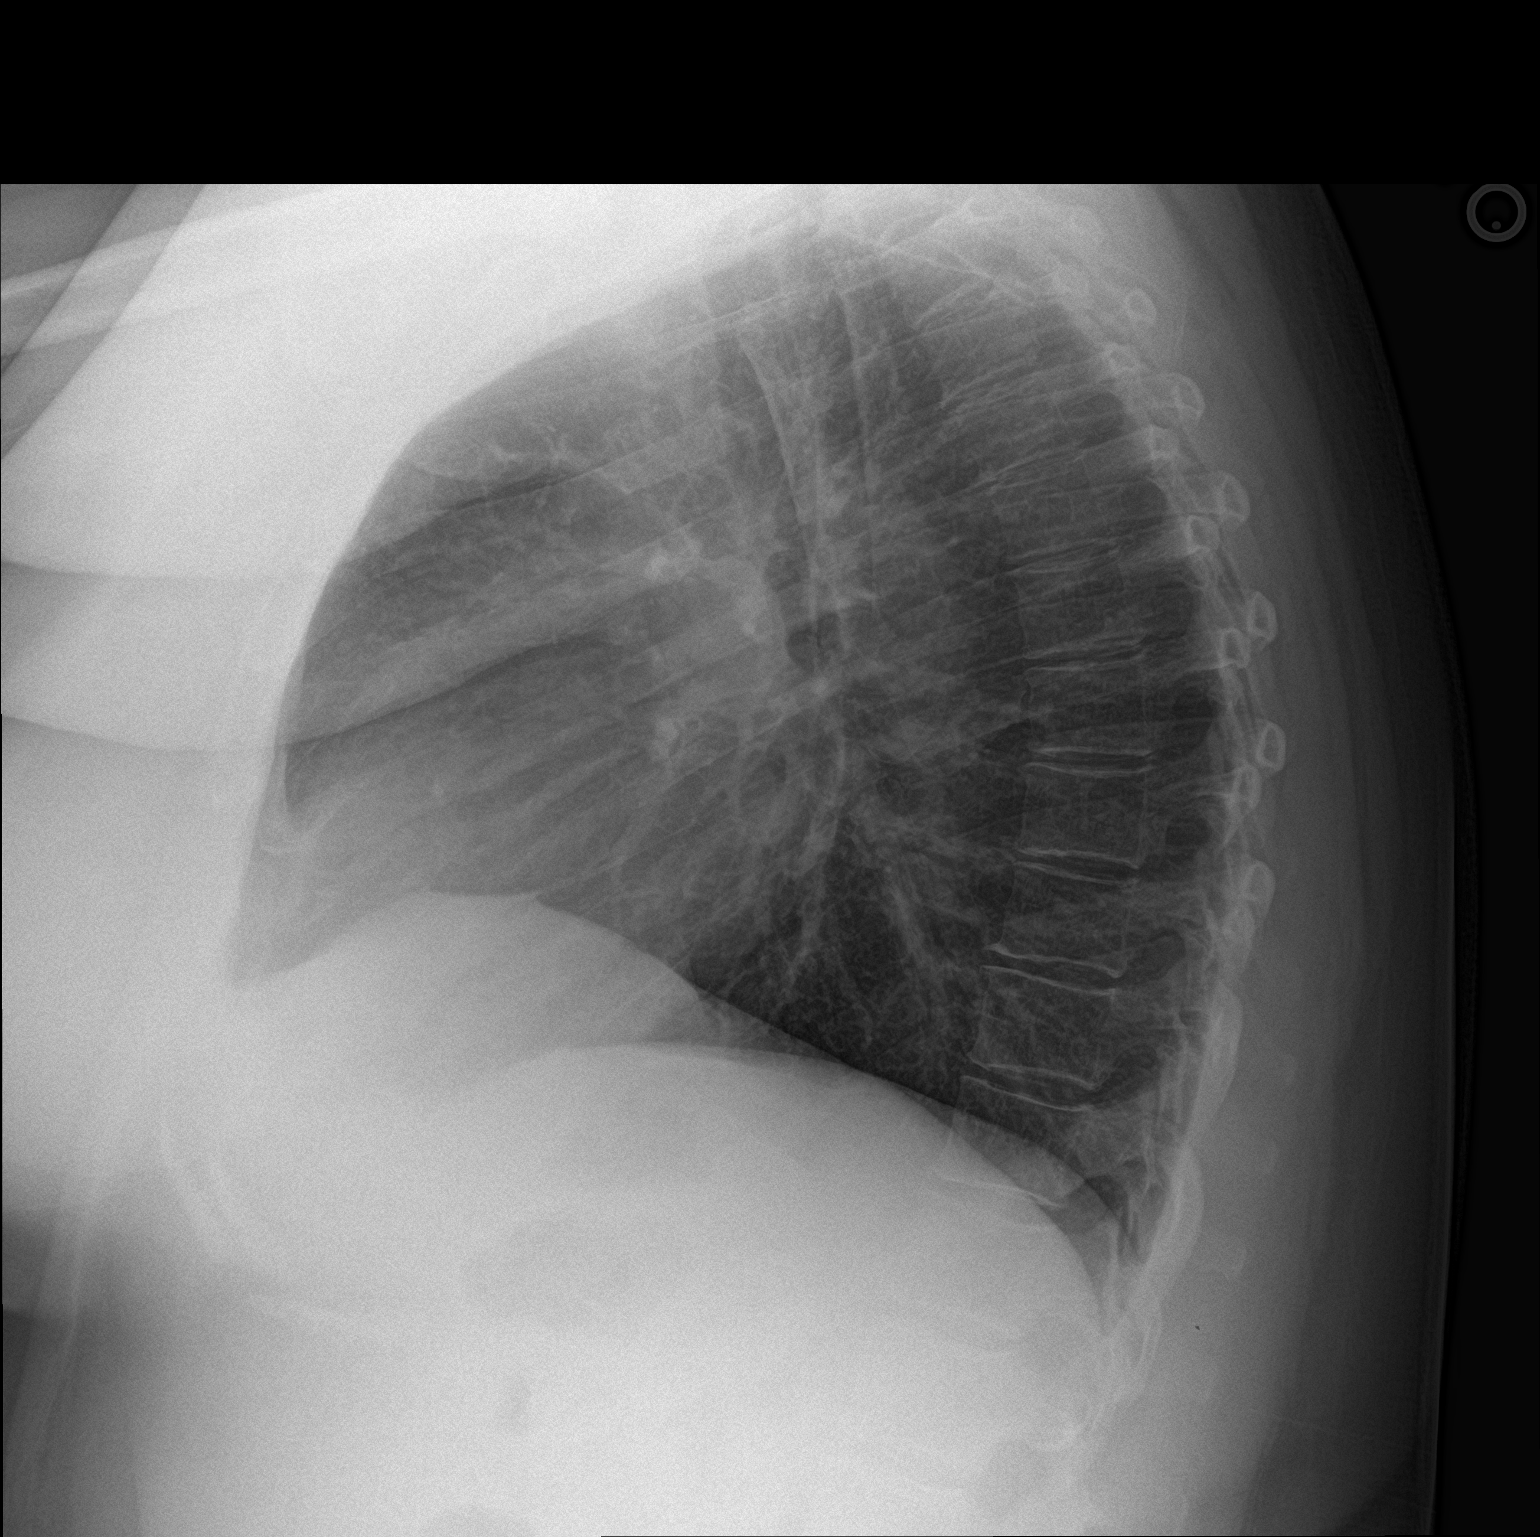

[2 of 2 positions shown; findings below may reference images not displayed]

FINDINGS: Normal cardiac and mediastinal contours. No consolidative pulmonary
opacities. No pleural effusion or pneumothorax. Regional skeleton is
unremarkable.
IMPRESSION: No active cardiopulmonary disease.

## 2017-04-16 ENCOUNTER — Encounter: Payer: Self-pay | Admitting: *Deleted

## 2017-04-16 ENCOUNTER — Emergency Department (INDEPENDENT_AMBULATORY_CARE_PROVIDER_SITE_OTHER)
Admission: EM | Admit: 2017-04-16 | Discharge: 2017-04-16 | Disposition: A | Discharge status: Home / Self Care | Source: Home / Self Care | Attending: Family Medicine | Admitting: Family Medicine

## 2017-04-16 DIAGNOSIS — J069 Acute upper respiratory infection, unspecified: Secondary | ICD-10-CM

## 2017-04-16 DIAGNOSIS — B9789 Other viral agents as the cause of diseases classified elsewhere: Secondary | ICD-10-CM | POA: Diagnosis not present

## 2017-04-16 DIAGNOSIS — J9801 Acute bronchospasm: Secondary | ICD-10-CM

## 2017-04-16 DIAGNOSIS — L255 Unspecified contact dermatitis due to plants, except food: Secondary | ICD-10-CM

## 2017-04-16 MED ORDER — DOXYCYCLINE HYCLATE 100 MG PO CAPS: 100.0000 mg | ORAL_CAPSULE | Freq: Two times a day (BID) | ORAL | 0 refills | Status: DC

## 2017-04-16 MED ORDER — ALBUTEROL SULFATE HFA 108 (90 BASE) MCG/ACT IN AERS: 2.0000 | INHALATION_SPRAY | RESPIRATORY_TRACT | 0 refills | Status: DC | PRN

## 2017-04-16 MED ORDER — PREDNISONE 20 MG PO TABS: ORAL_TABLET | ORAL | 0 refills | Status: DC

## 2017-04-16 MED ORDER — GUAIFENESIN-CODEINE 100-10 MG/5ML PO SOLN: ORAL | 0 refills | Status: DC

## 2017-04-16 MED ORDER — METHYLPREDNISOLONE SODIUM SUCC 125 MG IJ SOLR
80.0000 mg | Freq: Once | INTRAMUSCULAR | Status: AC
  Administered 2017-04-16: 80 mg via INTRAMUSCULAR

## 2017-04-16 NOTE — Discharge Instructions (Signed)
Begin prednisone on Wednesday 04/17/17. Take plain guaifenesin (1200mg  extended release tabs such as Mucinex) twice daily, with plenty of water, for cough and congestion.  May add Pseudoephedrine (30mg , one or two every 4 to 6 hours) for sinus congestion.  Get adequate rest.   May use Afrin nasal spray (or generic oxymetazoline) each morning for about 5 days and then discontinue.  Also recommend using saline nasal spray several times daily and saline nasal irrigation (AYR is a common brand).  Use Flonase nasal spray each morning after using Afrin nasal spray and saline nasal irrigation. Try warm salt water gargles for sore throat.  Stop all antihistamines for now, and other non-prescription cough/cold preparations. May apply Calamine lotion to poison ivy rash for itching as needed. Follow-up with family doctor if not improving about 7 to 10 days.

## 2017-04-16 NOTE — ED Triage Notes (Signed)
Patient c/o 1 week of poison ivy to chest and arms that is spreading. Also c/o cough and congestion, chills and sweats x 3 days. Taken Mucinex DM, Nyquil and Dayquil.

## 2017-04-16 NOTE — ED Provider Notes (Signed)
[facility]    CSN: [id number] Arrival date & time: 04/16/17  0954     History   Chief Complaint Chief Complaint  Patient presents with  . Poison Ivy  . Cough    HPI Robin Melton is a 31 y.o. female.   Patient presents with two complaints: 1)  About one week ago, after handling her pet goats, she developed pruritic rash on elbows and anterior chest that has persisted.  Her goats like to eat poison ivy. 2)  Patient complains of three day history of typical cold-like symptoms developing over several days, including mild sore throat, sinus congestion, chills, fatigue, and cough.  She has developed mild wheezing and shortness of breath with activity.  She notes that she always gets asthma-like symptoms during a cold, and has had to use an inhaler in the past.  She has a past history of pneumonia, and her colds tend to linger.  She continues to smoke.  She has a family history of asthma (father and son).   The history is provided by the patient.    Past Medical History:  Diagnosis Date  . Cancer ([place])    skin  . Fatty liver 07/04/2015  . Hyperlipidemia 07/04/2015   High triglycerides, low HDL, high VLDL  . Tachycardia     Patient Active Problem List   Diagnosis Date Noted  . Failure to attend appointment 12/20/2015  . GERD (gastroesophageal reflux disease) 08/22/2015  . Obesity 07/04/2015  . Fatty liver 07/04/2015  . Hyperlipidemia 07/04/2015  . Generalized anxiety disorder 06/28/2015  . Anxiety state 06/28/2015  . Left upper quadrant pain 06/28/2015  . Right upper quadrant pain 06/28/2015  . Tobacco abuse 12/16/2013  . Tachycardia   . Chest pain     Past Surgical History:  Procedure Laterality Date  . ABDOMINAL HYSTERECTOMY    . BREAST ENHANCEMENT SURGERY    . MYRINGOTOMY    . TEAR DUCT PROBING    . TUBAL LIGATION      OB History    No data available       Home Medications    Prior to Admission medications   Medication Sig Start Date  End Date Taking? Authorizing Provider  albuterol (PROVENTIL HFA;VENTOLIN HFA) 108 (90 Base) MCG/ACT inhaler Inhale 2 puffs into the lungs every 4 (four) hours as needed for wheezing or shortness of breath. 04/16/17   Kandra Nicolas, MD  diclofenac sodium (VOLTAREN) 1 % GEL Apply 2 g topically 4 (four) times daily. 04/04/17   Tasia Catchings, Amy V, PA-C  doxycycline (VIBRAMYCIN) 100 MG capsule Take 1 capsule (100 mg total) by mouth 2 (two) times daily. Take with food. 04/16/17   Kandra Nicolas, MD  guaiFENesin-codeine 100-10 MG/5ML syrup Take 42mL by mouth at bedtime as needed for cough 04/16/17   Kandra Nicolas, MD  meloxicam (MOBIC) 7.5 MG tablet Take 1 tablet (7.5 mg total) by mouth daily. 04/04/17   Tasia Catchings, Amy V, PA-C  predniSONE (DELTASONE) 20 MG tablet Take one tab by mouth twice daily for 4 days, then one daily for 3 days. Take with food. 04/16/17   Kandra Nicolas, MD    Family History Family History  Problem Relation Age of Onset  . Diabetes Mother   . Hypertension Mother   . Cancer Mother        skin CA  . Hyperlipidemia Mother   . Diabetes Maternal Grandmother   . Cancer Maternal Grandfather   . Diabetes Maternal Grandfather   .  Heart attack Maternal Grandfather   . Stroke Paternal Aunt     Social History Social History  Substance Use Topics  . Smoking status: Current Every Day Smoker    Packs/day: 1.00    Years: 12.00    Types: Cigarettes  . Smokeless tobacco: Never Used  . Alcohol use No     Comment: Rarely     Allergies   Bee venom; Morphine and related; and Sulfur   Review of Systems Review of Systems + sore throat + hoarse + sneezing + cough No pleuritic pain + wheezing + nasal congestion + post-nasal drainage No sinus pain/pressure No itchy/red eyes No earache No hemoptysis + SOB with activity. No fever, + chills No nausea No vomiting No abdominal pain No diarrhea No urinary symptoms + skin rash + fatigue No myalgias No headache Used OTC meds  without relief   Physical Exam Triage Vital Signs ED Triage Vitals  Enc Vitals Group     BP --      Pulse Rate 04/16/17 1016 (!) 118     Resp 04/16/17 1016 16     Temp 04/16/17 1016 98.7 F (37.1 C)     Temp Source 04/16/17 1016 Oral     SpO2 04/16/17 1016 95 %     Weight 04/16/17 1017 241 lb (109.3 kg)     Height --      Head Circumference --      Peak Flow --      Pain Score 04/16/17 1017 8     Pain Loc --      Pain Edu? --      Excl. in [place]? --    No data found.   Updated Vital Signs Pulse (!) 118   Temp 98.7 F (37.1 C) (Oral)   Resp 16   Wt 241 lb (109.3 kg)   LMP 02/20/2014   SpO2 95%   BMI 44.08 kg/m   Visual Acuity Right Eye Distance:   Left Eye Distance:   Bilateral Distance:    Right Eye Near:   Left Eye Near:    Bilateral Near:     Physical Exam Nursing notes and Vital Signs reviewed. Appearance:  Patient appears stated age, and in no acute distress Eyes:  Pupils are equal, round, and reactive to light and accomodation.  Extraocular movement is intact.  Conjunctivae are not inflamed  Ears:  Canals normal.  Right tympanic membrane has serous effusion.  Left tympanic membrane normal. Nose:  Congested turbinates.  No sinus tenderness.    Pharynx:  Normal Neck:  Supple.  Enlarged posterior/lateral nodes are palpated bilaterally, tender to palpation on the left.   Lungs:  Course breath sounds.  Breath sounds are equal.  Moving air well. Heart:  Regular rate and rhythm without murmurs, rubs, or gallops.  Abdomen:  Nontender without masses or hepatosplenomegaly.  Bowel sounds are present.  No CVA or flank tenderness.  Extremities:  No edema.  Skin:  Erythematous macular eruption both antecubital fossae and between breasts.  UC Treatments / Results  Labs (all labs ordered are listed, but only abnormal results are displayed) Labs Reviewed - No data to display  EKG  EKG Interpretation None       Radiology No results  found.  Procedures Procedures (including critical care time)  Medications Ordered in UC Medications  methylPREDNISolone sodium succinate (SOLU-MEDROL) 125 mg/2 mL injection 80 mg (not administered)     Initial Impression / Assessment and Plan / UC Course  I  have reviewed the triage vital signs and the nursing notes.  Pertinent labs & imaging results that were available during my care of the patient were reviewed by me and considered in my medical decision making (see chart for details).    With patient's history of seasonal rhinitis, family history of asthma, and past history of viral URI's that linger, she probably has a predisposition to mild reactive airways disease.  She now has congestion of a viral URI superimposed on her usual seasonal rhinitis.    Administered Solumedrol 80mg  IM. Begin doxycycline for atypical coverage (note patient has a past history of pneumonia and continues to smoke). Rx for Robitussin AC for night time cough.  Controlled Substance Prescriptions I have consulted the [place] Controlled Substances Registry for this patient, and feel the risk/benefit ratio today is favorable for proceeding with this prescription for a controlled substance.  Rx for albuterol inhaler. Begin prednisone burst/taper on Wednesday 04/17/17. Take plain guaifenesin (1200mg  extended release tabs such as Mucinex) twice daily, with plenty of water, for cough and congestion.  May add Pseudoephedrine (30mg , one or two every 4 to 6 hours) for sinus congestion.  Get adequate rest.   May use Afrin nasal spray (or generic oxymetazoline) each morning for about 5 days and then discontinue.  Also recommend using saline nasal spray several times daily and saline nasal irrigation (AYR is a common brand).  Use Flonase nasal spray each morning after using Afrin nasal spray and saline nasal irrigation. Try warm salt water gargles for sore throat.  Stop all antihistamines for now, and other non-prescription  cough/cold preparations. May apply Calamine lotion to poison ivy rash for itching as needed. Follow-up with family doctor if not improving about 7 to 10 days.     Final Clinical Impressions(s) / UC Diagnoses   Final diagnoses:  Viral URI with cough  Bronchospasm  Rhus dermatitis    New Prescriptions New Prescriptions   ALBUTEROL (PROVENTIL HFA;VENTOLIN HFA) 108 (90 BASE) MCG/ACT INHALER    Inhale 2 puffs into the lungs every 4 (four) hours as needed for wheezing or shortness of breath.   DOXYCYCLINE (VIBRAMYCIN) 100 MG CAPSULE    Take 1 capsule (100 mg total) by mouth 2 (two) times daily. Take with food.   GUAIFENESIN-CODEINE 100-10 MG/5ML SYRUP    Take 54mL by mouth at bedtime as needed for cough   PREDNISONE (DELTASONE) 20 MG TABLET    Take one tab by mouth twice daily for 4 days, then one daily for 3 days. Take with food.       Kandra Nicolas, MD 04/16/17 1056

## 2017-04-23 ENCOUNTER — Encounter: Payer: Self-pay | Admitting: *Deleted

## 2017-04-23 ENCOUNTER — Emergency Department (INDEPENDENT_AMBULATORY_CARE_PROVIDER_SITE_OTHER)
Admission: EM | Admit: 2017-04-23 | Discharge: 2017-04-23 | Disposition: A | Discharge status: Home / Self Care | Source: Home / Self Care | Attending: Family Medicine | Admitting: Family Medicine

## 2017-04-23 DIAGNOSIS — R21 Rash and other nonspecific skin eruption: Secondary | ICD-10-CM

## 2017-04-23 DIAGNOSIS — R05 Cough: Secondary | ICD-10-CM

## 2017-04-23 DIAGNOSIS — R059 Cough, unspecified: Secondary | ICD-10-CM

## 2017-04-23 MED ORDER — BENZONATATE 100 MG PO CAPS: 100.0000 mg | ORAL_CAPSULE | Freq: Three times a day (TID) | ORAL | 0 refills | Status: DC

## 2017-04-23 MED ORDER — TRIAMCINOLONE ACETONIDE 0.1 % EX CREA: 1.0000 "application " | TOPICAL_CREAM | Freq: Two times a day (BID) | CUTANEOUS | 0 refills | Status: DC

## 2017-04-23 NOTE — ED Provider Notes (Signed)
[facility]    CSN: [id number] Arrival date & time: 04/23/17  1103     History   Chief Complaint Chief Complaint  Patient presents with  . Poison Ivy  . Cough    HPI Robin Melton is a 31 y.o. female.   HPI Robin Melton is a 31 y.o. female presenting to UC with c/o continued intermittent dry cough as well as itching burning red rash to arms, chest, and under both breasts. Pt was seen at [facility] on 04/16/17 for same.  The cough was believed to be due to a viral URI with bronchospasm, however, due to hx of pneumonia, pt was prescribed Doxycycline to help with any potential underlying bacterial cause.  She has 1 day left of the antibiotic and states she has improved with the antibiotic as well as prednisone that was prescribed.  She does not feel as though she has pneumonia at this time, denies fever, chills, or SOB but states the pharmacy only filled half of her cough medication for at total of 5 nights due to the regulations from the new STOP act.  She is requesting more codeine cough medication.  She notes the prednisone did help with her cough significantly but it is worse at night.  She has not been taking any OTC medications for her cough.  Pt smokes 1 ppd cigarettes.    She notes the rash does not seem to have improved much with the steroids.  She has been using calamine lotion with mild relief.  She initially thought the rash was due to being around goats about 1-2 weeks ago but has not been around them since then.  Denies new soaps, lotions, or medications.    Past Medical History:  Diagnosis Date  . Cancer ([place])    skin  . Fatty liver 07/04/2015  . Hyperlipidemia 07/04/2015   High triglycerides, low HDL, high VLDL  . Tachycardia     Patient Active Problem List   Diagnosis Date Noted  . Failure to attend appointment 12/20/2015  . GERD (gastroesophageal reflux disease) 08/22/2015  . Obesity 07/04/2015  . Fatty liver 07/04/2015  . Hyperlipidemia 07/04/2015   . Generalized anxiety disorder 06/28/2015  . Anxiety state 06/28/2015  . Left upper quadrant pain 06/28/2015  . Right upper quadrant pain 06/28/2015  . Tobacco abuse 12/16/2013  . Tachycardia   . Chest pain     Past Surgical History:  Procedure Laterality Date  . ABDOMINAL HYSTERECTOMY    . BREAST ENHANCEMENT SURGERY    . MYRINGOTOMY    . TEAR DUCT PROBING    . TUBAL LIGATION      OB History    No data available       Home Medications    Prior to Admission medications   Medication Sig Start Date End Date Taking? Authorizing Provider  albuterol (PROVENTIL HFA;VENTOLIN HFA) 108 (90 Base) MCG/ACT inhaler Inhale 2 puffs into the lungs every 4 (four) hours as needed for wheezing or shortness of breath. 04/16/17   Kandra Nicolas, MD  benzonatate (TESSALON) 100 MG capsule Take 1-2 capsules (100-200 mg total) by mouth every 8 (eight) hours. 04/23/17   Noe Gens, PA-C  diclofenac sodium (VOLTAREN) 1 % GEL Apply 2 g topically 4 (four) times daily. 04/04/17   Tasia Catchings, Amy V, PA-C  doxycycline (VIBRAMYCIN) 100 MG capsule Take 1 capsule (100 mg total) by mouth 2 (two) times daily. Take with food. 04/16/17   Kandra Nicolas, MD  guaiFENesin-codeine 100-10 MG/5ML  syrup Take 30mL by mouth at bedtime as needed for cough 04/16/17   Kandra Nicolas, MD  meloxicam (MOBIC) 7.5 MG tablet Take 1 tablet (7.5 mg total) by mouth daily. 04/04/17   Tasia Catchings, Amy V, PA-C  predniSONE (DELTASONE) 20 MG tablet Take one tab by mouth twice daily for 4 days, then one daily for 3 days. Take with food. 04/16/17   Kandra Nicolas, MD  triamcinolone cream (KENALOG) 0.1 % Apply 1 application topically 2 (two) times daily. 04/23/17   Noe Gens, PA-C    Family History Family History  Problem Relation Age of Onset  . Diabetes Mother   . Hypertension Mother   . Cancer Mother        skin CA  . Hyperlipidemia Mother   . Diabetes Maternal Grandmother   . Cancer Maternal Grandfather   . Diabetes Maternal Grandfather     . Heart attack Maternal Grandfather   . Stroke Paternal Aunt     Social History Social History  Substance Use Topics  . Smoking status: Current Every Day Smoker    Packs/day: 1.00    Years: 12.00    Types: Cigarettes  . Smokeless tobacco: Never Used  . Alcohol use No     Comment: Rarely     Allergies   Bee venom; Morphine and related; and Sulfur   Review of Systems Review of Systems  Constitutional: Negative for appetite change, chills, fatigue and fever.  HENT: Positive for congestion. Negative for ear pain, rhinorrhea and sore throat.   Respiratory: Positive for cough. Negative for chest tightness, shortness of breath and wheezing.   Gastrointestinal: Negative for abdominal pain, diarrhea, nausea and vomiting.  Musculoskeletal: Negative for arthralgias, joint swelling and myalgias.  Skin: Positive for rash. Negative for wound.     Physical Exam Triage Vital Signs ED Triage Vitals  Enc Vitals Group     BP 04/23/17 1117 (!) 135/96     Pulse Rate 04/23/17 1117 (!) 113     Resp 04/23/17 1117 16     Temp 04/23/17 1117 98 F (36.7 C)     Temp Source 04/23/17 1117 Oral     SpO2 04/23/17 1117 95 %     Weight --      Height --      Head Circumference --      Peak Flow --      Pain Score 04/23/17 1124 5     Pain Loc --      Pain Edu? --      Excl. in [place]? --    No data found.   Updated Vital Signs BP (!) 135/96 (BP Location: Left Arm)   Pulse (!) 113   Temp 98 F (36.7 C) (Oral)   Resp 16   LMP 02/20/2014   SpO2 95%   Visual Acuity Right Eye Distance:   Left Eye Distance:   Bilateral Distance:    Right Eye Near:   Left Eye Near:    Bilateral Near:     Physical Exam  Constitutional: She is oriented to person, place, and time. She appears well-developed and well-nourished. No distress.  HENT:  Head: Normocephalic and atraumatic.  Right Ear: Tympanic membrane normal.  Left Ear: Tympanic membrane normal.  Nose: Nose normal.  Mouth/Throat: Uvula is  midline, oropharynx is clear and moist and mucous membranes are normal.  Eyes: EOM are normal.  Neck: Normal range of motion.  Cardiovascular: Normal rate and regular rhythm.   Pulmonary/Chest: Effort  normal and breath sounds normal. No respiratory distress. She has no wheezes. She has no rales.  Mild intermittent dry cough on exam. No wheeze. No evidence of respiratory distress. Able to speak in full sentences.  Musculoskeletal: Normal range of motion.  Neurological: She is alert and oriented to person, place, and time.  Skin: Skin is warm and dry. Rash noted. She is not diaphoretic. There is erythema.  Diffuse erythematous maculopapular rash to upper chest, bilateral arms, and faint erythema under breasts. Areas of excoriation, worse on chest. No discharge or bleeding. No induration or fluctuance.   Psychiatric: She has a normal mood and affect. Her behavior is normal.  Nursing note and vitals reviewed.    UC Treatments / Results  Labs (all labs ordered are listed, but only abnormal results are displayed) Labs Reviewed - No data to display  EKG  EKG Interpretation None       Radiology No results found.  Procedures Procedures (including critical care time)  Medications Ordered in UC Medications - No data to display   Initial Impression / Assessment and Plan / UC Course  I have reviewed the triage vital signs and the nursing notes.  Pertinent labs & imaging results that were available during my care of the patient were reviewed by me and considered in my medical decision making (see chart for details).     Cough has been present for a total of 10 days based on medical records from visit at [facility] on 04/16/17.    She has 1 day left of doxycycline. Lungs: CTAB, afebrile, O2 Sat 95% on RA Offered CXR to ensure no underlying pneumonia, pt declined.   Advised pt narcotic cough medication would not be refilled, not indicated for mild intermittent dry cough.  Will have pt try  Tessalon pearls.   Encouraged using humidifier at night and propping herself up to help with cough. Info on smoking cessation also provided.  Encouraged to establish care with a PCP for ongoing healthcare needs and f/u on cough if not improving in 7-10 days.  Rash- does not appear to be c/w contact dermatitis. Possible heat rash?  Pt is currently on doxycycline, which should treat any potential secondary sick infection. May try triamcinolone cream. F/u with dermatologist if not improving.   Final Clinical Impressions(s) / UC Diagnoses   Final diagnoses:  Cough  Erythematous rash    New Prescriptions Discharge Medication List as of 04/23/2017 11:33 AM    START taking these medications   Details  benzonatate (TESSALON) 100 MG capsule Take 1-2 capsules (100-200 mg total) by mouth every 8 (eight) hours., Starting Tue 04/23/2017, Normal    triamcinolone cream (KENALOG) 0.1 % Apply 1 application topically 2 (two) times daily., Starting Tue 04/23/2017, Normal         Controlled Substance Prescriptions [place] Controlled Substance Registry consulted? Not Applicable   Tyrell Antonio 04/23/17 1538

## 2017-04-23 NOTE — ED Triage Notes (Signed)
Patient reports she was told by Dr. Assunta Found to return in 1 week if she was not improving. Reports the poison ivy has spread and she has burning "all over". The cough is mildly improved. Reports the pharmacy only was able to fill half of the written cough suppressant. She needs another Rx to fill the other half.

## 2017-09-15 ENCOUNTER — Emergency Department (INDEPENDENT_AMBULATORY_CARE_PROVIDER_SITE_OTHER)
Admission: EM | Admit: 2017-09-15 | Discharge: 2017-09-15 | Disposition: A | Discharge status: Home / Self Care | Source: Home / Self Care | Attending: Family Medicine | Admitting: Family Medicine

## 2017-09-15 ENCOUNTER — Encounter: Payer: Self-pay | Admitting: Emergency Medicine

## 2017-09-15 DIAGNOSIS — J101 Influenza due to other identified influenza virus with other respiratory manifestations: Secondary | ICD-10-CM | POA: Diagnosis not present

## 2017-09-15 LAB — POCT INFLUENZA A/B
Influenza A, POC: POSITIVE — AB
Influenza B, POC: NEGATIVE

## 2017-09-15 MED ORDER — ONDANSETRON HCL 4 MG PO TABS: 4.0000 mg | ORAL_TABLET | Freq: Four times a day (QID) | ORAL | 0 refills | Status: DC

## 2017-09-15 MED ORDER — OSELTAMIVIR PHOSPHATE 75 MG PO CAPS: 75.0000 mg | ORAL_CAPSULE | Freq: Two times a day (BID) | ORAL | 0 refills | Status: DC

## 2017-09-15 NOTE — ED Provider Notes (Signed)
[facility]    CSN: [id number] Arrival date & time: 09/15/17  1222     History   Chief Complaint Chief Complaint  Patient presents with  . Fever  . Influenza    HPI Goldye Tourangeau is a 32 y.o. female.   HPI  Sylvania Moss is a 32 y.o. female presenting to UC with c/o flu-like symptoms that started yesterday.  Fever was 102.3*F last night and 101.1*F this morning.  She is c/o body aches, n/v/d, cough and congestion today.  She took Tylenol last night but nothing this morning. Body aches are most bothersome for her. Denies chest pain or SOB. Others have been sick around her.    Past Medical History:  Diagnosis Date  . Cancer ([place])    skin  . Fatty liver 07/04/2015  . Hyperlipidemia 07/04/2015   High triglycerides, low HDL, high VLDL  . Tachycardia     Patient Active Problem List   Diagnosis Date Noted  . Failure to attend appointment 12/20/2015  . GERD (gastroesophageal reflux disease) 08/22/2015  . Obesity 07/04/2015  . Fatty liver 07/04/2015  . Hyperlipidemia 07/04/2015  . Generalized anxiety disorder 06/28/2015  . Anxiety state 06/28/2015  . Left upper quadrant pain 06/28/2015  . Right upper quadrant pain 06/28/2015  . Tobacco abuse 12/16/2013  . Tachycardia   . Chest pain     Past Surgical History:  Procedure Laterality Date  . ABDOMINAL HYSTERECTOMY    . BREAST ENHANCEMENT SURGERY    . MYRINGOTOMY    . TEAR DUCT PROBING    . TUBAL LIGATION      OB History    No data available       Home Medications    Prior to Admission medications   Medication Sig Start Date End Date Taking? Authorizing Provider  acetaminophen (TYLENOL) 325 MG tablet Take by mouth every 6 (six) hours as needed.   Yes [provider]  aspirin 325 MG EC tablet Take 325 mg by mouth daily.   Yes [provider]  albuterol (PROVENTIL HFA;VENTOLIN HFA) 108 (90 Base) MCG/ACT inhaler Inhale 2 puffs into the lungs every 4 (four) hours as needed for  wheezing or shortness of breath. 04/16/17   Kandra Nicolas, MD  benzonatate (TESSALON) 100 MG capsule Take 1-2 capsules (100-200 mg total) by mouth every 8 (eight) hours. 04/23/17   Noe Gens, PA-C  diclofenac sodium (VOLTAREN) 1 % GEL Apply 2 g topically 4 (four) times daily. 04/04/17   Tasia Catchings, Amy V, PA-C  doxycycline (VIBRAMYCIN) 100 MG capsule Take 1 capsule (100 mg total) by mouth 2 (two) times daily. Take with food. 04/16/17   Kandra Nicolas, MD  guaiFENesin-codeine 100-10 MG/5ML syrup Take 8mL by mouth at bedtime as needed for cough 04/16/17   Kandra Nicolas, MD  meloxicam (MOBIC) 7.5 MG tablet Take 1 tablet (7.5 mg total) by mouth daily. 04/04/17   Tasia Catchings, Amy V, PA-C  ondansetron (ZOFRAN) 4 MG tablet Take 1 tablet (4 mg total) by mouth every 6 (six) hours. 09/15/17   Noe Gens, PA-C  oseltamivir (TAMIFLU) 75 MG capsule Take 1 capsule (75 mg total) by mouth every 12 (twelve) hours. 09/15/17   Noe Gens, PA-C  predniSONE (DELTASONE) 20 MG tablet Take one tab by mouth twice daily for 4 days, then one daily for 3 days. Take with food. 04/16/17   Kandra Nicolas, MD  triamcinolone cream (KENALOG) 0.1 % Apply 1 application topically 2 (two) times  daily. 04/23/17   Noe Gens, PA-C    Family History Family History  Problem Relation Age of Onset  . Diabetes Mother   . Hypertension Mother   . Cancer Mother        skin CA  . Hyperlipidemia Mother   . Diabetes Maternal Grandmother   . Cancer Maternal Grandfather   . Diabetes Maternal Grandfather   . Heart attack Maternal Grandfather   . Stroke Paternal Aunt     Social History Social History   Tobacco Use  . Smoking status: Current Every Day Smoker    Packs/day: 1.00    Years: 12.00    Pack years: 12.00    Types: Cigarettes  . Smokeless tobacco: Never Used  Substance Use Topics  . Alcohol use: No    Comment: Rarely  . Drug use: No     Allergies   Bee venom; Morphine and related; and Sulfur   Review of  Systems Review of Systems  Constitutional: Positive for chills, fatigue and fever.  HENT: Positive for congestion and rhinorrhea. Negative for ear pain, sore throat, trouble swallowing and voice change.   Respiratory: Positive for cough. Negative for shortness of breath.   Cardiovascular: Negative for chest pain and palpitations.  Gastrointestinal: Negative for abdominal pain, diarrhea, nausea and vomiting.  Musculoskeletal: Positive for arthralgias, back pain and myalgias.  Skin: Negative for rash.  Neurological: Positive for headaches. Negative for dizziness and light-headedness.     Physical Exam Triage Vital Signs ED Triage Vitals  Enc Vitals Group     BP 09/15/17 1313 115/82     Pulse Rate 09/15/17 1313 (!) 107     Resp 09/15/17 1313 16     Temp 09/15/17 1313 98.3 F (36.8 C)     Temp Source 09/15/17 1313 Oral     SpO2 09/15/17 1313 97 %     Weight 09/15/17 1315 240 lb (108.9 kg)     Height 09/15/17 1315 5\' 2"  (1.575 m)     Head Circumference --      Peak Flow --      Pain Score 09/15/17 1315 7     Pain Loc --      Pain Edu? --      Excl. in [place]? --    No data found.  Updated Vital Signs BP 115/82 (BP Location: Right Arm)   Pulse (!) 107   Temp 98.3 F (36.8 C) (Oral)   Resp 16   Ht 5\' 2"  (1.575 m)   Wt 240 lb (108.9 kg)   LMP 02/20/2014   SpO2 97%   BMI 43.90 kg/m   Visual Acuity Right Eye Distance:   Left Eye Distance:   Bilateral Distance:    Right Eye Near:   Left Eye Near:    Bilateral Near:     Physical Exam  Constitutional: She is oriented to person, place, and time. She appears well-developed and well-nourished.  Appears acutely ill but alert and oriented during exam  HENT:  Head: Normocephalic and atraumatic.  Right Ear: Tympanic membrane normal.  Left Ear: Tympanic membrane normal.  Nose: Nose normal.  Mouth/Throat: Uvula is midline, oropharynx is clear and moist and mucous membranes are normal.  Eyes: EOM are normal.  Neck: Normal  range of motion. Neck supple.  Cardiovascular: Regular rhythm. Tachycardia present.  Pulmonary/Chest: Effort normal and breath sounds normal. No stridor. No respiratory distress. She has no wheezes. She has no rales.  Musculoskeletal: Normal range of motion.  Lymphadenopathy:  She has no cervical adenopathy.  Neurological: She is alert and oriented to person, place, and time.  Skin: Skin is warm and dry. She is not diaphoretic.  Psychiatric: She has a normal mood and affect. Her behavior is normal.  Nursing note and vitals reviewed.    UC Treatments / Results  Labs (all labs ordered are listed, but only abnormal results are displayed) Labs Reviewed  POCT INFLUENZA A/B - Abnormal; Notable for the following components:      Result Value   Influenza A, POC Positive (*)    All other components within normal limits    EKG  EKG Interpretation None       Radiology No results found.  Procedures Procedures (including critical care time)  Medications Ordered in UC Medications - No data to display   Initial Impression / Assessment and Plan / UC Course  I have reviewed the triage vital signs and the nursing notes.  Pertinent labs & imaging results that were available during my care of the patient were reviewed by me and considered in my medical decision making (see chart for details).     Flu test: POSITIVE Flu A Will tx with Tamiflu Home care instructions provided F/u with PCP in 1 week as needed.   Final Clinical Impressions(s) / UC Diagnoses   Final diagnoses:  Influenza A    ED Discharge Orders        Ordered    oseltamivir (TAMIFLU) 75 MG capsule  Every 12 hours     09/15/17 1337    ondansetron (ZOFRAN) 4 MG tablet  Every 6 hours     09/15/17 1337       Controlled Substance Prescriptions [place] Controlled Substance Registry consulted? Not Applicable   Tyrell Antonio 09/15/17 1451

## 2017-09-15 NOTE — ED Triage Notes (Signed)
Patient presents to [facility] with C/O Fever and flu like symptoms since yesterday. States fever was 1023.2 last PM and 101.0 this AM. Body aches , nausea, vomiting and diarrhea today.

## 2017-09-15 NOTE — Discharge Instructions (Signed)
°  You may take 500mg acetaminophen every 4-6 hours or in combination with ibuprofen 400-600mg every 6-8 hours as needed for pain, inflammation, and fever. ° °Be sure to drink at least eight 8oz glasses of water to stay well hydrated and get at least 8 hours of sleep at night, preferably more while sick.  ° °Oseltamivir (Tamiflu) may cause stomach upset including nausea, vomiting and diarrhea.  It may also cause dizziness or hallucinations in children.  To help prevent stomach upset, you may take this medication with food.  If you are still having unwanted symptoms, you may stop taking this medication as it is not as important to finish the entire course like antibiotics.  If you have questions/concerns please call our office or follow up with your primary care provider.   ° °

## 2017-12-06 ENCOUNTER — Encounter: Payer: Self-pay | Admitting: Emergency Medicine

## 2017-12-06 ENCOUNTER — Emergency Department (INDEPENDENT_AMBULATORY_CARE_PROVIDER_SITE_OTHER)
Admission: EM | Admit: 2017-12-06 | Discharge: 2017-12-06 | Disposition: A | Discharge status: Home / Self Care | Source: Home / Self Care

## 2017-12-06 ENCOUNTER — Other Ambulatory Visit: Payer: Self-pay

## 2017-12-06 DIAGNOSIS — L732 Hidradenitis suppurativa: Secondary | ICD-10-CM | POA: Diagnosis not present

## 2017-12-06 DIAGNOSIS — R32 Unspecified urinary incontinence: Secondary | ICD-10-CM

## 2017-12-06 DIAGNOSIS — B3731 Acute candidiasis of vulva and vagina: Secondary | ICD-10-CM

## 2017-12-06 DIAGNOSIS — B373 Candidiasis of vulva and vagina: Secondary | ICD-10-CM

## 2017-12-06 HISTORY — DX: Stress incontinence (female) (male): N39.3

## 2017-12-06 HISTORY — DX: Hidradenitis suppurativa: L73.2

## 2017-12-06 HISTORY — DX: Overflow incontinence: N39.490

## 2017-12-06 LAB — POCT URINALYSIS DIP (MANUAL ENTRY)
Bilirubin, UA: NEGATIVE
Blood, UA: NEGATIVE
Glucose, UA: NEGATIVE mg/dL
Ketones, POC UA: NEGATIVE mg/dL
LEUKOCYTES UA: NEGATIVE
NITRITE UA: NEGATIVE
PROTEIN UA: NEGATIVE mg/dL
SPEC GRAV UA: 1.025 (ref 1.010–1.025)
UROBILINOGEN UA: 0.2 U/dL
pH, UA: 5.5 (ref 5.0–8.0)

## 2017-12-06 MED ORDER — SULFAMETHOXAZOLE-TRIMETHOPRIM 800-160 MG PO TABS: 1.0000 | ORAL_TABLET | Freq: Two times a day (BID) | ORAL | 0 refills | Status: AC

## 2017-12-06 MED ORDER — FLUCONAZOLE 200 MG PO TABS: 200.0000 mg | ORAL_TABLET | Freq: Every day | ORAL | 0 refills | Status: AC

## 2017-12-06 NOTE — ED Triage Notes (Signed)
Patient here for excrutiatingly painful rash and cysts along groin area bilateral and what she thinks is yeast infection of labial area; worsened over past 2 days.

## 2017-12-06 NOTE — Discharge Instructions (Signed)
Return if any problems.

## 2017-12-09 NOTE — ED Provider Notes (Signed)
[facility]    CSN: [id number] Arrival date & time: 12/06/17  [place]     History   Chief Complaint Chief Complaint  Patient presents with  . Cyst  . Rash    HPI Robin Melton is a 32 y.o. female.   Pt has multiple problems.  Pt thinks she has a yeast infection.  Pt reports she gets boils on her inner thighs and she has urinary incontinence.   Pt has 3 children.  Pt reports she has no urine control.  Pt reports she has had for an extended time.  Pt denies any diabetes.  No primary care.  Pt has not seen urology.  Pt reports she uses bladder leak pad but complains that she has odor.   The history is provided by the patient. No language interpreter was used.  Rash  Location:  Leg Quality: painful   Pain details:    Onset quality:  Sudden   Timing:  Constant Severity:  Moderate Chronicity:  Recurrent Relieved by:  Nothing Associated symptoms: no abdominal pain     Past Medical History:  Diagnosis Date  . Cancer ([place])    skin  . Fatty liver 07/04/2015  . Hidradenitis suppurativa   . Hyperlipidemia 07/04/2015   High triglycerides, low HDL, high VLDL  . Overflow stress incontinence of urine in female   . Tachycardia     Patient Active Problem List   Diagnosis Date Noted  . Failure to attend appointment 12/20/2015  . GERD (gastroesophageal reflux disease) 08/22/2015  . Obesity 07/04/2015  . Fatty liver 07/04/2015  . Hyperlipidemia 07/04/2015  . Generalized anxiety disorder 06/28/2015  . Anxiety state 06/28/2015  . Left upper quadrant pain 06/28/2015  . Right upper quadrant pain 06/28/2015  . Tobacco abuse 12/16/2013  . Tachycardia   . Chest pain     Past Surgical History:  Procedure Laterality Date  . ABDOMINAL HYSTERECTOMY    . ABDOMINAL SURGERY    . BREAST ENHANCEMENT SURGERY    . MYRINGOTOMY    . TEAR DUCT PROBING    . TUBAL LIGATION      OB History   None      Home Medications    Prior to Admission medications   Medication Sig  Start Date End Date Taking? Authorizing Provider  acetaminophen (TYLENOL) 325 MG tablet Take by mouth every 6 (six) hours as needed.    [provider]  albuterol (PROVENTIL HFA;VENTOLIN HFA) 108 (90 Base) MCG/ACT inhaler Inhale 2 puffs into the lungs every 4 (four) hours as needed for wheezing or shortness of breath. 04/16/17   Kandra Nicolas, MD  aspirin 325 MG EC tablet Take 325 mg by mouth daily.    [provider]  diclofenac sodium (VOLTAREN) 1 % GEL Apply 2 g topically 4 (four) times daily. 04/04/17   Tasia Catchings, Amy V, PA-C  fluconazole (DIFLUCAN) 200 MG tablet Take 1 tablet (200 mg total) by mouth daily for 7 days. 12/06/17 12/13/17  Fransico Meadow, PA-C  meloxicam (MOBIC) 7.5 MG tablet Take 1 tablet (7.5 mg total) by mouth daily. 04/04/17   Tasia Catchings, Amy V, PA-C  sulfamethoxazole-trimethoprim (BACTRIM DS,SEPTRA DS) 800-160 MG tablet Take 1 tablet by mouth 2 (two) times daily for 7 days. 12/06/17 12/13/17  Fransico Meadow, PA-C    Family History Family History  Problem Relation Age of Onset  . Diabetes Mother   . Hypertension Mother   . Cancer Mother        skin  CA  . Hyperlipidemia Mother   . Diabetes Maternal Grandmother   . Cancer Maternal Grandfather   . Diabetes Maternal Grandfather   . Heart attack Maternal Grandfather   . Stroke Paternal Aunt     Social History Social History   Tobacco Use  . Smoking status: Current Every Day Smoker    Packs/day: 1.00    Years: 12.00    Pack years: 12.00    Types: Cigarettes  . Smokeless tobacco: Never Used  Substance Use Topics  . Alcohol use: No    Comment: Rarely  . Drug use: No     Allergies   Bee venom; Morphine and related; and Sulfur   Review of Systems Review of Systems  Gastrointestinal: Negative for abdominal pain.  Skin: Positive for rash.  All other systems reviewed and are negative.    Physical Exam Triage Vital Signs ED Triage Vitals  Enc Vitals Group     BP 12/06/17 1937 125/85     Pulse Rate  12/06/17 1937 (!) 112     Resp 12/06/17 1937 18     Temp 12/06/17 1937 98.8 F (37.1 C)     Temp Source 12/06/17 1937 Oral     SpO2 12/06/17 1937 98 %     Weight 12/06/17 1938 240 lb (108.9 kg)     Height 12/06/17 1938 5\' 2"  (1.575 m)     Head Circumference --      Peak Flow --      Pain Score 12/06/17 1938 7     Pain Loc --      Pain Edu? --      Excl. in [place]? --    No data found.  Updated Vital Signs BP 125/85 (BP Location: Right Arm)   Pulse (!) 112   Temp 98.8 F (37.1 C) (Oral)   Resp 18   Ht 5\' 2"  (1.575 m)   Wt 240 lb (108.9 kg)   LMP 02/20/2014   SpO2 98%   BMI 43.90 kg/m   Visual Acuity Right Eye Distance:   Left Eye Distance:   Bilateral Distance:    Right Eye Near:   Left Eye Near:    Bilateral Near:     Physical Exam  Constitutional: She appears well-developed and well-nourished.  HENT:  Head: Normocephalic and atraumatic.  Eyes: Pupils are equal, round, and reactive to light. Conjunctivae are normal.  Neck: Normal range of motion. Neck supple.  Cardiovascular: Normal rate.  Abdominal: Soft. There is no tenderness.  Genitourinary: Vaginal discharge found.  Genitourinary Comments: Thick white discharge, external erythema,  Multiple raised pustules legs. Various states of healing and scarring,  strong urine smell   Musculoskeletal: Normal range of motion.  Skin: Skin is warm.     UC Treatments / Results  Labs (all labs ordered are listed, but only abnormal results are displayed) Labs Reviewed  POCT URINALYSIS DIP (MANUAL ENTRY)    EKG None  Radiology No results found.  Procedures Procedures (including critical care time)  Medications Ordered in UC Medications - No data to display  Initial Impression / Assessment and Plan / UC Course  I have reviewed the triage vital signs and the nursing notes.  Pertinent labs & imaging results that were available during my care of the patient were reviewed by me and considered in my medical  decision making (see chart for details).     MDM  Pt has hidradenitis suppurativa.  Yeast infection on exam.   I will treat pt  for yeast and infection.  Urine is clear.  No infection.  I referred pt to primary care.  I advised her to try poise tampoons to help with bladder leak.  Pt is advised to follow up with urology for assessment.  Final Clinical Impressions(s) / UC Diagnoses   Final diagnoses:  Hidradenitis  Vaginal yeast infection  Bladder leak  Yeast vaginitis     Discharge Instructions     Return if any problems   ED Prescriptions    Medication Sig Dispense Auth. Provider   sulfamethoxazole-trimethoprim (BACTRIM DS,SEPTRA DS) 800-160 MG tablet Take 1 tablet by mouth 2 (two) times daily for 7 days. 20 tablet Ritamarie Arkin K, [place]   fluconazole (DIFLUCAN) 200 MG tablet Take 1 tablet (200 mg total) by mouth daily for 7 days. 7 tablet Fransico Meadow, [place]     Controlled Substance Prescriptions [place] Controlled Substance Registry consulted? Not Applicable   Fransico Meadow, [place] 12/09/17 [id number]

## 2018-01-09 ENCOUNTER — Emergency Department (INDEPENDENT_AMBULATORY_CARE_PROVIDER_SITE_OTHER)
Admission: EM | Admit: 2018-01-09 | Discharge: 2018-01-09 | Disposition: A | Discharge status: Home / Self Care | Source: Home / Self Care | Attending: Family Medicine | Admitting: Family Medicine

## 2018-01-09 ENCOUNTER — Encounter: Payer: Self-pay | Admitting: Emergency Medicine

## 2018-01-09 DIAGNOSIS — J069 Acute upper respiratory infection, unspecified: Secondary | ICD-10-CM | POA: Diagnosis not present

## 2018-01-09 DIAGNOSIS — F17200 Nicotine dependence, unspecified, uncomplicated: Secondary | ICD-10-CM

## 2018-01-09 DIAGNOSIS — B9789 Other viral agents as the cause of diseases classified elsewhere: Secondary | ICD-10-CM | POA: Diagnosis not present

## 2018-01-09 DIAGNOSIS — J019 Acute sinusitis, unspecified: Secondary | ICD-10-CM

## 2018-01-09 MED ORDER — BENZONATATE 100 MG PO CAPS: 100.0000 mg | ORAL_CAPSULE | Freq: Three times a day (TID) | ORAL | 0 refills | Status: AC

## 2018-01-09 MED ORDER — ALBUTEROL SULFATE HFA 108 (90 BASE) MCG/ACT IN AERS: 1.0000 | INHALATION_SPRAY | Freq: Four times a day (QID) | RESPIRATORY_TRACT | 0 refills | Status: AC | PRN

## 2018-01-09 MED ORDER — PREDNISONE 20 MG PO TABS: ORAL_TABLET | ORAL | 0 refills | Status: AC

## 2018-01-09 MED ORDER — AZITHROMYCIN 250 MG PO TABS: 250.0000 mg | ORAL_TABLET | Freq: Every day | ORAL | 0 refills | Status: AC

## 2018-01-09 NOTE — ED Provider Notes (Signed)
[facility]    CSN: [id number] Arrival date & time: 01/09/18  0841     History   Chief Complaint Chief Complaint  Patient presents with  . Cough    HPI Robin Melton is a 32 y.o. female.   HPI  Robin Melton is a 32 y.o. female presenting to UC with c/o about 1 week with worsening cough, congestion and intermittent chest tightness.   She notes she gets sick like this twice a year and always needs antibiotics and prednisone and sometimes a prednisone shot.  She does not have anymore of her albuterol and is requesting a refill. Her mother has been sick for about 10 days with similar symptoms. Denies fever, chills, n/v/d. Pt smokes 1 pack of cigarettes per day.   Past Medical History:  Diagnosis Date  . Cancer ([place])    skin  . Fatty liver 07/04/2015  . Hidradenitis suppurativa   . Hyperlipidemia 07/04/2015   High triglycerides, low HDL, high VLDL  . Overflow stress incontinence of urine in female   . Tachycardia     Patient Active Problem List   Diagnosis Date Noted  . Failure to attend appointment 12/20/2015  . GERD (gastroesophageal reflux disease) 08/22/2015  . Obesity 07/04/2015  . Fatty liver 07/04/2015  . Hyperlipidemia 07/04/2015  . Generalized anxiety disorder 06/28/2015  . Anxiety state 06/28/2015  . Left upper quadrant pain 06/28/2015  . Right upper quadrant pain 06/28/2015  . Tobacco abuse 12/16/2013  . Tachycardia   . Chest pain     Past Surgical History:  Procedure Laterality Date  . ABDOMINAL HYSTERECTOMY    . ABDOMINAL SURGERY    . BREAST ENHANCEMENT SURGERY    . MYRINGOTOMY    . TEAR DUCT PROBING    . TUBAL LIGATION      OB History   None      Home Medications    Prior to Admission medications   Medication Sig Start Date End Date Taking? Authorizing Provider  acetaminophen (TYLENOL) 325 MG tablet Take by mouth every 6 (six) hours as needed.    [provider]  albuterol (PROVENTIL HFA;VENTOLIN HFA) 108 (90  Base) MCG/ACT inhaler Inhale 1-2 puffs into the lungs every 6 (six) hours as needed for wheezing or shortness of breath. 01/09/18   Noe Gens, PA-C  aspirin 325 MG EC tablet Take 325 mg by mouth daily.    [provider]  azithromycin (ZITHROMAX) 250 MG tablet Take 1 tablet (250 mg total) by mouth daily. Take first 2 tablets together, then 1 every day until finished. 01/09/18   Noe Gens, PA-C  benzonatate (TESSALON) 100 MG capsule Take 1-2 capsules (100-200 mg total) by mouth every 8 (eight) hours. 01/09/18   Noe Gens, PA-C  predniSONE (DELTASONE) 20 MG tablet 3 tabs po day one, then 2 po daily x 4 days 01/09/18   Noe Gens, PA-C    Family History Family History  Problem Relation Age of Onset  . Diabetes Mother   . Hypertension Mother   . Cancer Mother        skin CA  . Hyperlipidemia Mother   . Diabetes Maternal Grandmother   . Cancer Maternal Grandfather   . Diabetes Maternal Grandfather   . Heart attack Maternal Grandfather   . Stroke Paternal Aunt     Social History Social History   Tobacco Use  . Smoking status: Current Every Day Smoker    Packs/day: 1.00    Years:  12.00    Pack years: 12.00    Types: Cigarettes  . Smokeless tobacco: Never Used  Substance Use Topics  . Alcohol use: No    Comment: Rarely  . Drug use: No     Allergies   Bee venom; Morphine and related; and Sulfur   Review of Systems Review of Systems  Constitutional: Negative for chills and fever.  HENT: Positive for congestion and sinus pressure. Negative for ear pain, sore throat, trouble swallowing and voice change.   Respiratory: Positive for cough and chest tightness. Negative for shortness of breath.   Cardiovascular: Negative for chest pain and palpitations.  Gastrointestinal: Negative for abdominal pain, diarrhea, nausea and vomiting.  Musculoskeletal: Negative for arthralgias, back pain and myalgias.  Skin: Negative for rash.     Physical Exam Triage Vital  Signs ED Triage Vitals  Enc Vitals Group     BP      Pulse      Resp      Temp      Temp src      SpO2      Weight      Height      Head Circumference      Peak Flow      Pain Score      Pain Loc      Pain Edu?      Excl. in [place]?    No data found.  Updated Vital Signs BP 116/82 (BP Location: Right Arm)   Pulse (!) 109   Temp 98 F (36.7 C) (Oral)   Ht 5\' 2"  (1.575 m)   Wt 248 lb (112.5 kg)   LMP 02/20/2014   SpO2 98%   BMI 45.36 kg/m   Visual Acuity Right Eye Distance:   Left Eye Distance:   Bilateral Distance:    Right Eye Near:   Left Eye Near:    Bilateral Near:     Physical Exam  Constitutional: She is oriented to person, place, and time. She appears well-developed and well-nourished. No distress.  HENT:  Head: Normocephalic and atraumatic.  Right Ear: Tympanic membrane normal.  Left Ear: Tympanic membrane normal.  Nose: Mucosal edema present. Right sinus exhibits maxillary sinus tenderness and frontal sinus tenderness. Left sinus exhibits maxillary sinus tenderness and frontal sinus tenderness.  Mouth/Throat: Uvula is midline, oropharynx is clear and moist and mucous membranes are normal.  Eyes: EOM are normal.  Neck: Normal range of motion. Neck supple.  Cardiovascular: Normal rate and regular rhythm.  Pulmonary/Chest: Effort normal. No stridor. No respiratory distress. She has no wheezes. She has rhonchi (faint, diffuse). She has no rales.  Musculoskeletal: Normal range of motion.  Neurological: She is alert and oriented to person, place, and time.  Skin: Skin is warm and dry. She is not diaphoretic.  Psychiatric: She has a normal mood and affect. Her behavior is normal.  Nursing note and vitals reviewed.    UC Treatments / Results  Labs (all labs ordered are listed, but only abnormal results are displayed) Labs Reviewed - No data to display  EKG None  Radiology No results found.  Procedures Procedures (including critical care  time)  Medications Ordered in UC Medications - No data to display  Initial Impression / Assessment and Plan / UC Course  I have reviewed the triage vital signs and the nursing notes.  Pertinent labs & imaging results that were available during my care of the patient were reviewed by me and considered in my medical  decision making (see chart for details).    Hx of recurrent bronchitis. Exam c/w sinusitis Will tx as noted below  Final Clinical Impressions(s) / UC Diagnoses   Final diagnoses:  Viral URI with cough  Acute rhinosinusitis  Current every day smoker     Discharge Instructions      It is strongly recommended that you stop smoking as this will be the fasted and best way to help with your symptoms.  In the long term, you may develop chronic cough and congestion known as COPD.    Please take antibiotics as prescribed and be sure to complete entire course even if you start to feel better to ensure infection does not come back.  Please establish care with a primary care provider for ongoing healthcare needs, recheck of symptoms if not improving in 7-10 days, and for help with stopping smoking.     ED Prescriptions    Medication Sig Dispense Auth. Provider   azithromycin (ZITHROMAX) 250 MG tablet Take 1 tablet (250 mg total) by mouth daily. Take first 2 tablets together, then 1 every day until finished. 6 tablet Gerarda Fraction, Boris Engelmann O, PA-C   benzonatate (TESSALON) 100 MG capsule Take 1-2 capsules (100-200 mg total) by mouth every 8 (eight) hours. 21 capsule Gerarda Fraction, Arrie Borrelli O, PA-C   predniSONE (DELTASONE) 20 MG tablet 3 tabs po day one, then 2 po daily x 4 days 11 tablet Kensly Bowmer O, PA-C   albuterol (PROVENTIL HFA;VENTOLIN HFA) 108 (90 Base) MCG/ACT inhaler Inhale 1-2 puffs into the lungs every 6 (six) hours as needed for wheezing or shortness of breath. 1 Inhaler Noe Gens, PA-C     Controlled Substance Prescriptions [place] Controlled Substance Registry consulted? Not  Applicable   Tyrell Antonio 01/09/18 1242

## 2018-01-09 NOTE — Discharge Instructions (Signed)
°  It is strongly recommended that you stop smoking as this will be the fasted and best way to help with your symptoms.  In the long term, you may develop chronic cough and congestion known as COPD.    Please take antibiotics as prescribed and be sure to complete entire course even if you start to feel better to ensure infection does not come back.  Please establish care with a primary care provider for ongoing healthcare needs, recheck of symptoms if not improving in 7-10 days, and for help with stopping smoking.

## 2019-07-17 ENCOUNTER — Other Ambulatory Visit: Payer: Self-pay

## 2019-07-17 DIAGNOSIS — Z20822 Contact with and (suspected) exposure to covid-19: Secondary | ICD-10-CM

## 2019-07-18 LAB — NOVEL CORONAVIRUS, NAA: SARS-CoV-2, NAA: NOT DETECTED
